# Patient Record
Sex: Male | Born: 1935 | Race: White | Hispanic: No | Marital: Married | Smoking: Former smoker
Health system: Southern US, Community
[De-identification: ages and names within clinical notes are randomized; demographics above are authoritative.]

## PROBLEM LIST (undated history)

## (undated) DIAGNOSIS — E785 Hyperlipidemia, unspecified: Secondary | ICD-10-CM

## (undated) DIAGNOSIS — I252 Old myocardial infarction: Secondary | ICD-10-CM

## (undated) DIAGNOSIS — I739 Peripheral vascular disease, unspecified: Secondary | ICD-10-CM

## (undated) DIAGNOSIS — E119 Type 2 diabetes mellitus without complications: Secondary | ICD-10-CM

## (undated) DIAGNOSIS — I10 Essential (primary) hypertension: Secondary | ICD-10-CM

## (undated) DIAGNOSIS — F0390 Unspecified dementia without behavioral disturbance: Secondary | ICD-10-CM

## (undated) HISTORY — PX: HERNIA REPAIR: SHX51

## (undated) HISTORY — DX: Type 2 diabetes mellitus without complications: E11.9

## (undated) HISTORY — DX: Peripheral vascular disease, unspecified: I73.9

## (undated) HISTORY — DX: Old myocardial infarction: I25.2

## (undated) HISTORY — DX: Essential (primary) hypertension: I10

## (undated) HISTORY — DX: Unspecified dementia, unspecified severity, without behavioral disturbance, psychotic disturbance, mood disturbance, and anxiety: F03.90

## (undated) HISTORY — DX: Hyperlipidemia, unspecified: E78.5

## (undated) HISTORY — PX: CORONARY ANGIOPLASTY: SHX604

---

## 2006-11-23 ENCOUNTER — Encounter: Payer: Self-pay | Admitting: Internal Medicine

## 2006-12-21 ENCOUNTER — Encounter: Payer: Self-pay | Admitting: Internal Medicine

## 2010-10-23 ENCOUNTER — Ambulatory Visit: Payer: Self-pay | Admitting: Internal Medicine

## 2012-08-04 ENCOUNTER — Inpatient Hospital Stay: Payer: Self-pay | Admitting: Internal Medicine

## 2012-08-04 LAB — COMPREHENSIVE METABOLIC PANEL
Albumin: 3.9 g/dL (ref 3.4–5.0)
Alkaline Phosphatase: 148 U/L — ABNORMAL HIGH (ref 50–136)
Anion Gap: 13 (ref 7–16)
Calcium, Total: 9.4 mg/dL (ref 8.5–10.1)
Chloride: 102 mmol/L (ref 98–107)
Creatinine: 1.42 mg/dL — ABNORMAL HIGH (ref 0.60–1.30)
EGFR (African American): 55 — ABNORMAL LOW
EGFR (Non-African Amer.): 47 — ABNORMAL LOW
Potassium: 4 mmol/L (ref 3.5–5.1)
Sodium: 138 mmol/L (ref 136–145)

## 2012-08-04 LAB — CBC
MCH: 28.9 pg (ref 26.0–34.0)
RDW: 14.4 % (ref 11.5–14.5)
WBC: 12.2 10*3/uL — ABNORMAL HIGH (ref 3.8–10.6)

## 2012-08-04 LAB — URINALYSIS, COMPLETE
Bilirubin,UR: NEGATIVE
Blood: NEGATIVE
Ketone: NEGATIVE
Leukocyte Esterase: NEGATIVE
Ph: 5 (ref 4.5–8.0)
RBC,UR: <1 /HPF (ref 0–5)
Squamous Epithelial: NONE SEEN

## 2012-08-04 LAB — LIPASE, BLOOD: Lipase: 511 U/L — ABNORMAL HIGH (ref 73–393)

## 2012-08-04 LAB — CK TOTAL AND CKMB (NOT AT ARMC)
CK, Total: 64 U/L (ref 35–232)
CK-MB: 1.1 ng/mL (ref 0.5–3.6)

## 2012-08-04 LAB — TROPONIN I: Troponin-I: 0.07 ng/mL — ABNORMAL HIGH

## 2012-08-05 DIAGNOSIS — I2510 Atherosclerotic heart disease of native coronary artery without angina pectoris: Secondary | ICD-10-CM

## 2012-08-05 DIAGNOSIS — R079 Chest pain, unspecified: Secondary | ICD-10-CM

## 2012-08-05 DIAGNOSIS — I214 Non-ST elevation (NSTEMI) myocardial infarction: Secondary | ICD-10-CM

## 2012-08-05 LAB — TROPONIN I
Troponin-I: 1.1 ng/mL — ABNORMAL HIGH
Troponin-I: 0.56 ng/mL — ABNORMAL HIGH

## 2012-08-05 LAB — LIPID PANEL
Cholesterol: 114 mg/dL (ref 0–200)
HDL Cholesterol: 46 mg/dL (ref 40–60)
Ldl Cholesterol, Calc: 48 mg/dL (ref 0–100)
Triglycerides: 99 mg/dL (ref 0–200)
VLDL Cholesterol, Calc: 20 mg/dL (ref 5–40)

## 2012-08-05 LAB — CBC WITH DIFFERENTIAL/PLATELET
Basophil #: 0.1 10*3/uL (ref 0.0–0.1)
Basophil %: 0.8 %
Lymphocyte #: 1.8 10*3/uL (ref 1.0–3.6)
Lymphocyte %: 18.2 %
Monocyte %: 11.8 %
Platelet: 182 10*3/uL (ref 150–440)
RBC: 4.48 10*6/uL (ref 4.40–5.90)
RDW: 14.1 % (ref 11.5–14.5)

## 2012-08-05 LAB — COMPREHENSIVE METABOLIC PANEL
Alkaline Phosphatase: 138 U/L — ABNORMAL HIGH (ref 50–136)
BUN: 15 mg/dL (ref 7–18)
Chloride: 103 mmol/L (ref 98–107)
Co2: 27 mmol/L (ref 21–32)
EGFR (African American): >60
SGPT (ALT): 25 U/L (ref 12–78)
Total Protein: 6.3 g/dL — ABNORMAL LOW (ref 6.4–8.2)

## 2012-08-05 LAB — PROTIME-INR
INR: 1
Prothrombin Time: 13.9 secs (ref 11.5–14.7)

## 2012-08-05 LAB — APTT: Activated PTT: 65.1 secs — ABNORMAL HIGH (ref 23.6–35.9)

## 2012-08-05 LAB — LIPASE, BLOOD: Lipase: 169 U/L (ref 73–393)

## 2012-08-05 LAB — MAGNESIUM: Magnesium: 2 mg/dL

## 2012-09-08 ENCOUNTER — Encounter: Payer: No Typology Code available for payment source | Admitting: Cardiovascular Disease

## 2013-08-12 ENCOUNTER — Inpatient Hospital Stay: Payer: Self-pay | Admitting: Surgery

## 2013-08-12 LAB — CBC WITH DIFFERENTIAL/PLATELET
Basophil #: 0.1 10*3/uL (ref 0.0–0.1)
Basophil %: 1 %
EOS ABS: 0.1 10*3/uL (ref 0.0–0.7)
Eosinophil %: 1.1 %
HCT: 40.1 % (ref 40.0–52.0)
HGB: 13.5 g/dL (ref 13.0–18.0)
Lymphocyte #: 1.1 10*3/uL (ref 1.0–3.6)
Lymphocyte %: 11.5 %
MCH: 30.6 pg (ref 26.0–34.0)
MCHC: 33.6 g/dL (ref 32.0–36.0)
MCV: 91 fL (ref 80–100)
Monocyte #: 0.6 x10 3/mm (ref 0.2–1.0)
Monocyte %: 6.1 %
NEUTROS PCT: 80.3 %
Neutrophil #: 7.8 10*3/uL — ABNORMAL HIGH (ref 1.4–6.5)
Platelet: 248 10*3/uL (ref 150–440)
RBC: 4.41 10*6/uL (ref 4.40–5.90)
RDW: 13.5 % (ref 11.5–14.5)
WBC: 9.8 10*3/uL (ref 3.8–10.6)

## 2013-08-12 LAB — COMPREHENSIVE METABOLIC PANEL
ALBUMIN: 3.3 g/dL — AB (ref 3.4–5.0)
ANION GAP: 8 (ref 7–16)
AST: 22 U/L (ref 15–37)
Alkaline Phosphatase: 152 U/L — ABNORMAL HIGH
BILIRUBIN TOTAL: 0.4 mg/dL (ref 0.2–1.0)
BUN: 14 mg/dL (ref 7–18)
CHLORIDE: 105 mmol/L (ref 98–107)
CO2: 26 mmol/L (ref 21–32)
Calcium, Total: 9 mg/dL (ref 8.5–10.1)
Creatinine: 1.17 mg/dL (ref 0.60–1.30)
GFR CALC NON AF AMER: 59 — AB
GLUCOSE: 156 mg/dL — AB (ref 65–99)
OSMOLALITY: 281 (ref 275–301)
POTASSIUM: 4 mmol/L (ref 3.5–5.1)
SGPT (ALT): 15 U/L (ref 12–78)
Sodium: 139 mmol/L (ref 136–145)
TOTAL PROTEIN: 6.8 g/dL (ref 6.4–8.2)

## 2013-08-12 LAB — URINALYSIS, COMPLETE
BLOOD: NEGATIVE
Bacteria: NONE SEEN
Bilirubin,UR: NEGATIVE
Glucose,UR: NEGATIVE mg/dL (ref 0–75)
Leukocyte Esterase: NEGATIVE
Nitrite: NEGATIVE
PH: 5 (ref 4.5–8.0)
Protein: NEGATIVE
RBC,UR: 1 /HPF (ref 0–5)
Specific Gravity: 1.036 (ref 1.003–1.030)

## 2013-08-12 LAB — TROPONIN I: Troponin-I: <0.02 ng/mL

## 2013-08-12 LAB — LIPASE, BLOOD: Lipase: 242 U/L (ref 73–393)

## 2013-08-13 LAB — CBC WITH DIFFERENTIAL/PLATELET
Basophil #: 0 10*3/uL (ref 0.0–0.1)
Basophil %: 0.4 %
Eosinophil #: 0 10*3/uL (ref 0.0–0.7)
Eosinophil %: 0.1 %
HCT: 36.6 % — AB (ref 40.0–52.0)
HGB: 11.9 g/dL — AB (ref 13.0–18.0)
LYMPHS ABS: 0.8 10*3/uL — AB (ref 1.0–3.6)
LYMPHS PCT: 6.2 %
MCH: 29.9 pg (ref 26.0–34.0)
MCHC: 32.4 g/dL (ref 32.0–36.0)
MCV: 92 fL (ref 80–100)
MONOS PCT: 6.4 %
Monocyte #: 0.8 x10 3/mm (ref 0.2–1.0)
NEUTROS ABS: 10.8 10*3/uL — AB (ref 1.4–6.5)
Neutrophil %: 86.9 %
Platelet: 210 10*3/uL (ref 150–440)
RBC: 3.98 10*6/uL — ABNORMAL LOW (ref 4.40–5.90)
RDW: 13.6 % (ref 11.5–14.5)
WBC: 12.4 10*3/uL — AB (ref 3.8–10.6)

## 2013-08-13 LAB — BASIC METABOLIC PANEL
ANION GAP: 5 — AB (ref 7–16)
BUN: 20 mg/dL — AB (ref 7–18)
CHLORIDE: 103 mmol/L (ref 98–107)
CO2: 27 mmol/L (ref 21–32)
Calcium, Total: 8.2 mg/dL — ABNORMAL LOW (ref 8.5–10.1)
Creatinine: 1.24 mg/dL (ref 0.60–1.30)
EGFR (Non-African Amer.): 55 — ABNORMAL LOW
Glucose: 205 mg/dL — ABNORMAL HIGH (ref 65–99)
Osmolality: 279 (ref 275–301)
Potassium: 4.5 mmol/L (ref 3.5–5.1)
Sodium: 135 mmol/L — ABNORMAL LOW (ref 136–145)

## 2013-08-14 LAB — BASIC METABOLIC PANEL
Anion Gap: 4 — ABNORMAL LOW (ref 7–16)
BUN: 19 mg/dL — ABNORMAL HIGH (ref 7–18)
CHLORIDE: 104 mmol/L (ref 98–107)
Calcium, Total: 8.5 mg/dL (ref 8.5–10.1)
Co2: 29 mmol/L (ref 21–32)
Creatinine: 1.21 mg/dL (ref 0.60–1.30)
EGFR (Non-African Amer.): 57 — ABNORMAL LOW
Glucose: 146 mg/dL — ABNORMAL HIGH (ref 65–99)
Osmolality: 279 (ref 275–301)
Potassium: 4.5 mmol/L (ref 3.5–5.1)
Sodium: 137 mmol/L (ref 136–145)

## 2013-08-14 LAB — CBC WITH DIFFERENTIAL/PLATELET
BASOS ABS: 0.1 10*3/uL (ref 0.0–0.1)
Basophil %: 0.4 %
Eosinophil #: 0.1 10*3/uL (ref 0.0–0.7)
Eosinophil %: 0.5 %
HCT: 35.1 % — ABNORMAL LOW (ref 40.0–52.0)
HGB: 11.2 g/dL — ABNORMAL LOW (ref 13.0–18.0)
LYMPHS PCT: 8.8 %
Lymphocyte #: 1 10*3/uL (ref 1.0–3.6)
MCH: 29.3 pg (ref 26.0–34.0)
MCHC: 31.8 g/dL — ABNORMAL LOW (ref 32.0–36.0)
MCV: 92 fL (ref 80–100)
MONOS PCT: 6.8 %
Monocyte #: 0.8 x10 3/mm (ref 0.2–1.0)
Neutrophil #: 9.7 10*3/uL — ABNORMAL HIGH (ref 1.4–6.5)
Neutrophil %: 83.5 %
Platelet: 195 10*3/uL (ref 150–440)
RBC: 3.82 10*6/uL — ABNORMAL LOW (ref 4.40–5.90)
RDW: 13.6 % (ref 11.5–14.5)
WBC: 11.6 10*3/uL — ABNORMAL HIGH (ref 3.8–10.6)

## 2013-08-14 LAB — TSH: THYROID STIMULATING HORM: 0.907 u[IU]/mL

## 2013-08-14 LAB — HEMOGLOBIN A1C: Hemoglobin A1C: 6.7 % — ABNORMAL HIGH (ref 4.2–6.3)

## 2013-08-15 LAB — CBC WITH DIFFERENTIAL/PLATELET
Basophil #: 0.1 10*3/uL (ref 0.0–0.1)
Basophil %: 0.6 %
EOS ABS: 0.2 10*3/uL (ref 0.0–0.7)
Eosinophil %: 1.5 %
HCT: 34.3 % — AB (ref 40.0–52.0)
HGB: 11.5 g/dL — AB (ref 13.0–18.0)
LYMPHS ABS: 1.1 10*3/uL (ref 1.0–3.6)
Lymphocyte %: 10.5 %
MCH: 30.6 pg (ref 26.0–34.0)
MCHC: 33.6 g/dL (ref 32.0–36.0)
MCV: 91 fL (ref 80–100)
Monocyte #: 0.8 x10 3/mm (ref 0.2–1.0)
Monocyte %: 7.8 %
NEUTROS ABS: 8.4 10*3/uL — AB (ref 1.4–6.5)
NEUTROS PCT: 79.6 %
Platelet: 217 10*3/uL (ref 150–440)
RBC: 3.77 10*6/uL — ABNORMAL LOW (ref 4.40–5.90)
RDW: 13.4 % (ref 11.5–14.5)
WBC: 10.5 10*3/uL (ref 3.8–10.6)

## 2013-08-16 LAB — BASIC METABOLIC PANEL
ANION GAP: 2 — AB (ref 7–16)
BUN: 10 mg/dL (ref 7–18)
CALCIUM: 8.6 mg/dL (ref 8.5–10.1)
CO2: 32 mmol/L (ref 21–32)
Chloride: 108 mmol/L — ABNORMAL HIGH (ref 98–107)
Creatinine: 0.84 mg/dL (ref 0.60–1.30)
EGFR (African American): >60
EGFR (Non-African Amer.): >60
Glucose: 140 mg/dL — ABNORMAL HIGH (ref 65–99)
OSMOLALITY: 284 (ref 275–301)
Potassium: 3.6 mmol/L (ref 3.5–5.1)
Sodium: 142 mmol/L (ref 136–145)

## 2013-08-16 LAB — CBC WITH DIFFERENTIAL/PLATELET
Basophil #: 0.1 10*3/uL (ref 0.0–0.1)
Basophil %: 1.1 %
Eosinophil #: 0.6 10*3/uL (ref 0.0–0.7)
Eosinophil %: 6.2 %
HCT: 36.7 % — AB (ref 40.0–52.0)
HGB: 11.9 g/dL — ABNORMAL LOW (ref 13.0–18.0)
LYMPHS ABS: 1.3 10*3/uL (ref 1.0–3.6)
Lymphocyte %: 13.3 %
MCH: 29.5 pg (ref 26.0–34.0)
MCHC: 32.5 g/dL (ref 32.0–36.0)
MCV: 91 fL (ref 80–100)
Monocyte #: 0.9 x10 3/mm (ref 0.2–1.0)
Monocyte %: 9 %
NEUTROS PCT: 70.4 %
Neutrophil #: 6.9 10*3/uL — ABNORMAL HIGH (ref 1.4–6.5)
PLATELETS: 246 10*3/uL (ref 150–440)
RBC: 4.05 10*6/uL — ABNORMAL LOW (ref 4.40–5.90)
RDW: 13.4 % (ref 11.5–14.5)
WBC: 9.8 10*3/uL (ref 3.8–10.6)

## 2013-08-17 LAB — BASIC METABOLIC PANEL
Anion Gap: 7 (ref 7–16)
BUN: 7 mg/dL (ref 7–18)
CALCIUM: 7.9 mg/dL — AB (ref 8.5–10.1)
CHLORIDE: 106 mmol/L (ref 98–107)
CREATININE: 0.83 mg/dL (ref 0.60–1.30)
Co2: 26 mmol/L (ref 21–32)
EGFR (African American): >60
EGFR (Non-African Amer.): >60
GLUCOSE: 231 mg/dL — AB (ref 65–99)
Osmolality: 283 (ref 275–301)
Potassium: 3.7 mmol/L (ref 3.5–5.1)
Sodium: 139 mmol/L (ref 136–145)

## 2013-08-17 LAB — CBC WITH DIFFERENTIAL/PLATELET
Basophil #: 0.1 10*3/uL (ref 0.0–0.1)
Basophil %: 0.8 %
Eosinophil #: 0.1 10*3/uL (ref 0.0–0.7)
Eosinophil %: 1.1 %
HCT: 33.7 % — AB (ref 40.0–52.0)
HGB: 11.4 g/dL — AB (ref 13.0–18.0)
LYMPHS PCT: 6.3 %
Lymphocyte #: 0.6 10*3/uL — ABNORMAL LOW (ref 1.0–3.6)
MCH: 30.5 pg (ref 26.0–34.0)
MCHC: 33.9 g/dL (ref 32.0–36.0)
MCV: 90 fL (ref 80–100)
Monocyte #: 0.8 x10 3/mm (ref 0.2–1.0)
Monocyte %: 7.4 %
Neutrophil #: 8.7 10*3/uL — ABNORMAL HIGH (ref 1.4–6.5)
Neutrophil %: 84.4 %
PLATELETS: 250 10*3/uL (ref 150–440)
RBC: 3.74 10*6/uL — AB (ref 4.40–5.90)
RDW: 13.4 % (ref 11.5–14.5)
WBC: 10.3 10*3/uL (ref 3.8–10.6)

## 2013-08-17 LAB — MAGNESIUM: MAGNESIUM: 1.5 mg/dL — AB

## 2013-08-17 LAB — POTASSIUM: Potassium: 4 mmol/L (ref 3.5–5.1)

## 2013-08-18 LAB — BASIC METABOLIC PANEL
Anion Gap: 3 — ABNORMAL LOW (ref 7–16)
BUN: 5 mg/dL — AB (ref 7–18)
CO2: 29 mmol/L (ref 21–32)
Calcium, Total: 8.2 mg/dL — ABNORMAL LOW (ref 8.5–10.1)
Chloride: 109 mmol/L — ABNORMAL HIGH (ref 98–107)
Creatinine: 0.85 mg/dL (ref 0.60–1.30)
EGFR (African American): >60
EGFR (Non-African Amer.): >60
Glucose: 168 mg/dL — ABNORMAL HIGH (ref 65–99)
OSMOLALITY: 282 (ref 275–301)
Potassium: 4.3 mmol/L (ref 3.5–5.1)
Sodium: 141 mmol/L (ref 136–145)

## 2013-08-18 LAB — CBC WITH DIFFERENTIAL/PLATELET
BASOS PCT: 0.5 %
Basophil #: 0.1 10*3/uL (ref 0.0–0.1)
EOS ABS: 0.6 10*3/uL (ref 0.0–0.7)
EOS PCT: 4.6 %
HCT: 36.7 % — AB (ref 40.0–52.0)
HGB: 12.1 g/dL — AB (ref 13.0–18.0)
LYMPHS PCT: 8 %
Lymphocyte #: 1 10*3/uL (ref 1.0–3.6)
MCH: 30.1 pg (ref 26.0–34.0)
MCHC: 33 g/dL (ref 32.0–36.0)
MCV: 91 fL (ref 80–100)
Monocyte #: 1.2 x10 3/mm — ABNORMAL HIGH (ref 0.2–1.0)
Monocyte %: 9.4 %
NEUTROS ABS: 9.5 10*3/uL — AB (ref 1.4–6.5)
Neutrophil %: 77.5 %
PLATELETS: 251 10*3/uL (ref 150–440)
RBC: 4.02 10*6/uL — ABNORMAL LOW (ref 4.40–5.90)
RDW: 13.7 % (ref 11.5–14.5)
WBC: 12.3 10*3/uL — ABNORMAL HIGH (ref 3.8–10.6)

## 2013-08-19 LAB — CBC WITH DIFFERENTIAL/PLATELET
Basophil #: 0.1 10*3/uL (ref 0.0–0.1)
Basophil %: 1 %
Eosinophil #: 0.5 10*3/uL (ref 0.0–0.7)
Eosinophil %: 4.2 %
HCT: 36.8 % — AB (ref 40.0–52.0)
HGB: 11.8 g/dL — AB (ref 13.0–18.0)
LYMPHS PCT: 10.5 %
Lymphocyte #: 1.3 10*3/uL (ref 1.0–3.6)
MCH: 29.1 pg (ref 26.0–34.0)
MCHC: 32 g/dL (ref 32.0–36.0)
MCV: 91 fL (ref 80–100)
Monocyte #: 1.1 x10 3/mm — ABNORMAL HIGH (ref 0.2–1.0)
Monocyte %: 9.3 %
Neutrophil #: 9 10*3/uL — ABNORMAL HIGH (ref 1.4–6.5)
Neutrophil %: 75 %
PLATELETS: 283 10*3/uL (ref 150–440)
RBC: 4.05 10*6/uL — AB (ref 4.40–5.90)
RDW: 13.9 % (ref 11.5–14.5)
WBC: 12 10*3/uL — ABNORMAL HIGH (ref 3.8–10.6)

## 2013-08-19 LAB — BASIC METABOLIC PANEL
Anion Gap: 4 — ABNORMAL LOW (ref 7–16)
BUN: 7 mg/dL (ref 7–18)
Calcium, Total: 8.3 mg/dL — ABNORMAL LOW (ref 8.5–10.1)
Chloride: 108 mmol/L — ABNORMAL HIGH (ref 98–107)
Co2: 29 mmol/L (ref 21–32)
Creatinine: 0.85 mg/dL (ref 0.60–1.30)
EGFR (African American): >60
EGFR (Non-African Amer.): >60
Glucose: 136 mg/dL — ABNORMAL HIGH (ref 65–99)
Osmolality: 281 (ref 275–301)
Potassium: 3.9 mmol/L (ref 3.5–5.1)
Sodium: 141 mmol/L (ref 136–145)

## 2013-08-22 LAB — CBC WITH DIFFERENTIAL/PLATELET
Basophil #: 0.1 10*3/uL (ref 0.0–0.1)
Basophil %: 1.3 %
Eosinophil #: 0.2 10*3/uL (ref 0.0–0.7)
Eosinophil %: 2.7 %
HCT: 34.6 % — AB (ref 40.0–52.0)
HGB: 11.4 g/dL — ABNORMAL LOW (ref 13.0–18.0)
LYMPHS PCT: 14.1 %
Lymphocyte #: 1.2 10*3/uL (ref 1.0–3.6)
MCH: 29.8 pg (ref 26.0–34.0)
MCHC: 33 g/dL (ref 32.0–36.0)
MCV: 90 fL (ref 80–100)
Monocyte #: 0.7 x10 3/mm (ref 0.2–1.0)
Monocyte %: 8.6 %
Neutrophil #: 6.2 10*3/uL (ref 1.4–6.5)
Neutrophil %: 73.3 %
Platelet: 276 10*3/uL (ref 150–440)
RBC: 3.83 10*6/uL — AB (ref 4.40–5.90)
RDW: 13.8 % (ref 11.5–14.5)
WBC: 8.5 10*3/uL (ref 3.8–10.6)

## 2013-08-22 LAB — BASIC METABOLIC PANEL
Anion Gap: 3 — ABNORMAL LOW (ref 7–16)
BUN: 4 mg/dL — ABNORMAL LOW (ref 7–18)
CALCIUM: 8 mg/dL — AB (ref 8.5–10.1)
CHLORIDE: 107 mmol/L (ref 98–107)
Co2: 31 mmol/L (ref 21–32)
Creatinine: 0.69 mg/dL (ref 0.60–1.30)
EGFR (African American): >60
EGFR (Non-African Amer.): >60
Glucose: 141 mg/dL — ABNORMAL HIGH (ref 65–99)
Osmolality: 281 (ref 275–301)
POTASSIUM: 3.1 mmol/L — AB (ref 3.5–5.1)
SODIUM: 141 mmol/L (ref 136–145)

## 2013-10-19 ENCOUNTER — Encounter: Payer: Self-pay | Admitting: Podiatrist

## 2013-10-19 ENCOUNTER — Ambulatory Visit (INDEPENDENT_AMBULATORY_CARE_PROVIDER_SITE_OTHER): Payer: Medicare Other | Admitting: Podiatrist

## 2013-10-19 VITALS — Resp 16 | Ht 63.0 in | Wt 216.0 lb

## 2013-10-19 DIAGNOSIS — M79609 Pain in unspecified limb: Secondary | ICD-10-CM

## 2013-10-19 DIAGNOSIS — B351 Tinea unguium: Secondary | ICD-10-CM

## 2013-10-19 NOTE — Progress Notes (Signed)
   Subjective:    Patient ID: Shaun Davis, male    DOB: October 08, 1932, 80 y.o.   MRN: [id number]  HPI Comments: My toenails need trimming and they do hurt. A nurse would trim his toenails and he would trim them and we went to see dr cline for toenails. He wouldn't go back to dr cline.     Review of Systems     Objective:   Physical Exam  GENERAL APPEARANCE: Alert, conversant. Appropriately groomed. No acute distress.  VASCULAR: Pedal pulses palpable at 1/4 DP and PT bilateral.  Capillary refill time is intact to all digits,   NEUROLOGIC: sensation is intact epicritically and protectively to 5.07 monofilament at 5/5 sites bilateral.  Light touch is intact bilateral, vibratory sensation intact bilateral, achilles tendon reflex is intact bilateral.  MUSCULOSKELETAL: acceptable muscle strength, tone and stability bilateral.  Intrinsic muscluature intact bilateral.   DERMATOLOGIC: toenails are severely mycotic, thick, and discolored.  They is no sign of bacterial infection or paronychia present. No preulcerative lesions are seen, no interdigital maceration noted.  No open lesions present.         Assessment & Plan:  Diabetes with peripheral angiopathy, symptomatic mycotic toenails Plan:  Diabetic exam performed.  Toenails debrided today.  Patients wife is wondering if he should have his large toenails removed.  I advised against this due to him not having any infection rushing the removal of the toenails and the fact that they are so neglected and long, if they are painful the pain will likely subside after a simple trimming as long as we can keep up the with nails routinely.

## 2013-10-19 NOTE — Patient Instructions (Signed)
Diabetes and Foot Care Diabetes may cause you to have problems because of poor blood supply (circulation) to your feet and legs. This may cause the skin on your feet to become thinner, break easier, and heal more slowly. Your skin may become dry, and the skin may peel and crack. You may also have nerve damage in your legs and feet causing decreased feeling in them. You may not notice minor injuries to your feet that could lead to infections or more serious problems. Taking care of your feet is one of the most important things you can do for yourself.  HOME CARE INSTRUCTIONS  Wear shoes at all times, even in the house. Do not go barefoot. Bare feet are easily injured.  Check your feet daily for blisters, cuts, and redness. If you cannot see the bottom of your feet, use a mirror or ask someone for help.  Wash your feet with warm water (do not use hot water) and mild soap. Then pat your feet and the areas between your toes until they are completely dry. Do not soak your feet as this can dry your skin.  Apply a moisturizing lotion or petroleum jelly (that does not contain alcohol and is unscented) to the skin on your feet and to dry, brittle toenails. Do not apply lotion between your toes.  Trim your toenails straight across. Do not dig under them or around the cuticle. File the edges of your nails with an emery board or nail file.  Do not cut corns or calluses or try to remove them with medicine.  Wear clean socks or stockings every day. Make sure they are not too tight. Do not wear knee-high stockings since they may decrease blood flow to your legs.  Wear shoes that fit properly and have enough cushioning. To break in new shoes, wear them for just a few hours a day. This prevents you from injuring your feet. Always look in your shoes before you put them on to be sure there are no objects inside.  Do not cross your legs. This may decrease the blood flow to your feet.  If you find a minor scrape,  cut, or break in the skin on your feet, keep it and the skin around it clean and dry. These areas may be cleansed with mild soap and water. Do not cleanse the area with peroxide, alcohol, or iodine.  When you remove an adhesive bandage, be sure not to damage the skin around it.  If you have a wound, look at it several times a day to make sure it is healing.  Do not use heating pads or hot water bottles. They may burn your skin. If you have lost feeling in your feet or legs, you may not know it is happening until it is too late.  Make sure your health care provider performs a complete foot exam at least annually or more often if you have foot problems. Report any cuts, sores, or bruises to your health care provider immediately. SEEK MEDICAL CARE IF:   You have an injury that is not healing.  You have cuts or breaks in the skin.  You have an ingrown nail.  You notice redness on your legs or feet.  You feel burning or tingling in your legs or feet.  You have pain or cramps in your legs and feet.  Your legs or feet are numb.  Your feet always feel cold. SEEK IMMEDIATE MEDICAL CARE IF:   There is increasing redness,   swelling, or pain in or around a wound.  There is a red line that goes up your leg.  Pus is coming from a wound.  You develop a fever or as directed by your health care provider.  You notice a bad smell coming from an ulcer or wound. Document Released: 06/05/2000 Document Revised: 02/08/2013 Document Reviewed: 11/15/2012 Exit[employer] Patient Information 2014 [employer].  

## 2014-01-18 ENCOUNTER — Ambulatory Visit: Payer: Medicare Other | Admitting: Podiatrist

## 2014-10-12 NOTE — Consult Note (Signed)
General Aspect 81 year old male with a known history of dementia, peripheral vascular disease, hypertension, history of MI status post angioplasty in 1986 at [facility] presentig after a fall, chest pain.Cardiology was consulted for chest pain, NSTEMI.  The patient lives with his wife, normally does not go outside the home and walks around in the home without much difficulty, He went outside to pick up the mail and slipped on the ice and slid into the ditch. He was not able to get up. Nearby friends and neighbors saw him and helped him to get up.   EMS was called who  requested him to come to the Emergency Department. He was very adamant not wanting to come to the ER. On further questioning by family and EMS, he acknowledged having intermittent chest pain for the last 2 days, mainly midsternal in location, and he finally decided to come to the Emergency Department. he was hurting in the chest and right knee on which he fell.   While in the ED, he was found to have troponin of 0.07 and was still hurting about 7 out of 10. He denies any injury on his chest pain and describes this pain as a [place] kicking on him on the chest.    previous heart attack in 1986.   Present Illness . He is having a difficulty walking, is unable to lie flat for about a year now and usually just sleeps in the chair. Legs are swollen and getting worse.  Unna boots have been placed for his leg swelling. They are also having a hard time keeping his legs elevated as he is unable to lie flat.    PAST MEDICAL HISTORY:   1.  Diabetes.  2.  Hypertension.  3.  Hyperlipidemia.  4.  History of MI status post angioplasty in 1986.  5.  Peripheral vascular disease.  6.  Dementia.   ALLERGIES:  PENICILLIN.   FAMILY HISTORY:  Mother died of heart attack.   SOCIAL HISTORY:  No smoking. No alcohol. No IV drugs of abuse. He lives at home with his wife.   Physical Exam:  GEN well developed, well nourished, no acute distress    HEENT pink conjunctivae   NECK supple   RESP clear BS   CARD Regular rate and rhythm  No murmur   ABD denies tenderness  soft   LYMPH negative neck   EXTR negative edema   SKIN normal to palpation   NEURO motor/sensory function intact, back pain   PSYCH alert, A+O to time, place, person, good insight   Review of Systems:  Subjective/Chief Complaint BAck pain, chest pain resolved   General: Fatigue  Weakness   Skin: No Complaints   ENT: No Complaints   Neck: No Complaints   Respiratory: No Complaints   Cardiovascular: Chest pain or discomfort   Gastrointestinal: No Complaints   Vascular: No Complaints   Musculoskeletal: back pain   Neurologic: No Complaints   Hematologic: No Complaints   Endocrine: No Complaints   Psychiatric: No Complaints   Review of Systems: All other systems were reviewed and found to be negative   Medications/Allergies Reviewed Medications/Allergies reviewed        Admit Diagnosis:   CHEST PAIN: Onset Date: 05-Aug-2012, Status: Active, Description: CHEST PAIN  Home Medications: Medication Instructions Status  verapamil 200 mg/24 hours oral capsule, extended release 1 cap(s) orally once a day (at bedtime) Active  quinapril 20 mg oral tablet 1 tab(s) orally once a day Active  metformin extended release 500 mg oral tablet, extended release 2 tab(s) orally 2 times a day Active  aspirin 325 mg oral tablet 1 tab(s) orally once a day Active  Tylenol 325 mg oral tablet 2 tab(s) orally every 4 hours, As Needed- for Pain  Active  atorvastatin 40 mg oral tablet 1 tab(s) orally once a day (at bedtime) Active  NovoLog FlexPen 100 units/mL subcutaneous solution 40 unit(s) subcutaneous at lunch Active  NovoLog FlexPen 100 units/mL subcutaneous solution 30 unit(s) subcutaneous once a day, with dinner Active  donepezil 10 mg oral tablet 1 tab(s) orally once a day (at bedtime) Active  Levemir 100 units/mL subcutaneous solution 40 unit(s)  subcutaneous once a day, in the AM Active  Levemir 100 units/mL subcutaneous solution 50 unit(s) subcutaneous once a day (at bedtime) Active  tramadol 50 mg oral tablet 1 tab(s) orally every 6 hours, As Needed- for Pain  Active  torsemide 20 mg oral tablet 1 tab(s) orally once a day Active  Namenda 5 mg oral tablet 1 tab(s) orally once a day Active   Lab Results:  Hepatic:  14-Feb-14 04:25   Bilirubin, Total -  Alkaline Phosphatase -  SGPT (ALT) -  SGOT (AST) -  Total Protein, Serum -  Albumin, Serum -  Routine Chem:  14-Feb-14 04:25   Amylase, Serum - (Result(s) reported on 05 Aug 2012 at 06:57AM.)  Cholesterol, Serum -  Triglycerides, Serum -  HDL (INHOUSE) -  VLDL Cholesterol Calculated -  LDL Cholesterol Calculated - (Result(s) reported on 05 Aug 2012 at 06:57AM.)  Magnesium, Serum - (1.8-2.4 THERAPEUTIC RANGE: 4-7 mg/dL TOXIC: > 10 mg/dL  -----------------------)  Lipase -  Glucose, Serum -  BUN -  Creatinine (comp) -  Sodium, Serum -  Potassium, Serum -  Chloride, Serum -  CO2, Serum -  Calcium (Total), Serum -  Osmolality (calc) -  eGFR (African American) -  eGFR (Non-African American) - (eGFR values <40m/min/1.73 m2 may be an indication of chronic kidney disease (CKD). Calculated eGFR is useful in patients with stable renal function. The eGFR calculation will not be reliable in acutely ill patients when serum creatinine is changing rapidly. It is not useful in  patients on dialysis. The eGFR calculation may not be applicable to patients at the low and high extremes of body sizes, pregnant women, and vegetarians.)  Anion Gap -  Result Comment lipase - quantity not suffient  Result(s) reported on 05 Aug 2012 at 06:38AM.  Cardiac:  14-Feb-14 04:25   Troponin I  1.10 (0.00-0.05 0.05 ng/mL or less: NEGATIVE  Repeat testing in 3-6 hrs  if clinically indicated. >0.05 ng/mL: POTENTIAL  MYOCARDIAL INJURY. Repeat  testing in 3-6 hrs if  clinically  indicated. NOTE: An increase or decrease  of 30% or more on serial  testing suggests a  clinically important change)  Routine Coag:  14-Feb-14 04:25   Activated PTT (APTT)  > 160.0 (A HCT value >55% may artifactually increase the APTT. In one study, the increase was an average of 19%. Reference: "Effect on Routine and Special Coagulation Testing Values of Citrate Anticoagulant Adjustment in Patients with High HCT Values." American Journal of Clinical Pathology 2006;126:400-405.)  Prothrombin 13.9  INR 1.0 (INR reference interval applies to patients on anticoagulant therapy. A single INR therapeutic range for coumarins is not optimal for all indications; however, the suggested range for most indications is 2.0 - 3.0. Exceptions to the INR Reference Range may include: Prosthetic heart valves, acute myocardial infarction, prevention of  myocardial infarction, and combinations of aspirin and anticoagulant. The need for a higher or lower target INR must be assessed individually. Reference: The Pharmacology and Management of the Vitamin K  antagonists: the seventh ACCP Conference on Antithrombotic and Thrombolytic Therapy. ULAGT.3646 Sept:126 (3suppl): [id number]. A HCT value >55% may artifactually increase the PT.  In one study,  the increase was an average of 25%. Reference:  "Effect on Routine and Special Coagulation Testing Values of Citrate Anticoagulant Adjustment in Patients with High HCT Values." American Journal of Clinical Pathology 2006;126:400-405.)  Routine Hem:  14-Feb-14 04:25   WBC (CBC) 9.7  RBC (CBC) 4.48  Hemoglobin (CBC) 13.2  Hematocrit (CBC)  39.8  Platelet Count (CBC) 182  MCV 89  MCH 29.5  MCHC 33.3  RDW 14.1  Neutrophil % 67.0  Lymphocyte % 18.2  Monocyte % 11.8  Eosinophil % 2.2  Basophil % 0.8  Neutrophil # 6.5  Lymphocyte # 1.8  Monocyte #  1.1  Eosinophil # 0.2  Basophil # 0.1 (Result(s) reported on 05 Aug 2012 at 05:11AM.)   EKG:   Interpretation EKG shows NSR with LAD, PVCs   Radiology Results: XRay:    13-Feb-14 18:09, Chest PA and Lateral  Chest PA and Lateral   REASON FOR EXAM:    cp  COMMENTS:       PROCEDURE: DXR - DXR CHEST PA (OR AP) AND LATERAL  - Aug 04 2012  6:09PM     RESULT: Mild atelectasis versus infiltrates lung bases. Heart size normal.    IMPRESSION:  Mild atelectasis versus infiltrates both lung bases.        Verified By: Osa Craver, M.D., MD    Penicillin: Hives  Vital Signs/Nurse's Notes: **Vital Signs.:   14-Feb-14 08:40  Vital Signs Type Routine  Temperature Temperature (F) 98  Celsius 36.6  Pulse Pulse 77  Respirations Respirations 18  Systolic BP Systolic BP 803  Diastolic BP (mmHg) Diastolic BP (mmHg) 64  Mean BP 80  Pulse Ox % Pulse Ox % 94  Pulse Ox Activity Level  At rest  Oxygen Delivery 2L    Impression 81 year old male with a known history of dementia, peripheral vascular disease, hypertension, history of MI status post angioplasty in 1986 at [facility] presentig after a fall, chest pain.Cardiology was consulted for chest pain, NSTEMI.  1) Chest pain, NSTEMI troponin to 1 Chest pain free Atypical and typical symptoms known CAD in the past at [facility] in the 1980s. STress test ordered, completed this AM, results pending --COntinue asa,ace, statin,  consider very low dose b-blocker  2) Edema recommend echo to exclude pulmonary HTN/diastolic CHF WOuld continue torsemide  3) Unsteady gait fell in the ice, chronic back pain May need rehab?  4) Dementia appropriate today   Electronic Signatures: Ida Rogue (MD)  (Signed 14-Feb-14 10:58)  Authored: General Aspect/Present Illness, History and Physical Exam, Review of System, Health Issues, Home Medications, Labs, EKG , Radiology, Allergies, Vital Signs/Nurse's Notes, Impression/Plan   Last Updated: 14-Feb-14 10:58 by Ida Rogue (MD)

## 2014-10-12 NOTE — H&P (Signed)
PATIENT NAME:  Shaun Davis, Shaun Davis MR#:  [id number] DATE OF BIRTH:  1933-03-27  DATE OF ADMISSION:  08/04/2012  PRIMARY CARE PHYSICIAN:  Katherina Right C. Arlana Pouch, MD  REQUESTING PHYSICIAN:  Caleen Jobs. Braud, MD  CHIEF COMPLAINT:  Chest pain.   HISTORY OF PRESENT ILLNESS:  This is a 81 year old male with a known history of dementia, peripheral vascular disease, hypertension, history of MI status post angioplasty in 1986 at [facility] is being admitted for unstable angina and possible non-ST elevation MI. The patient normally does not go outside the home and walks around in the home without much difficulty, but today he went outside without anybody's notice to pick up the mail and slipped on the ice and slid into the ditch next to the mailbox. He was not able to get up. Nearby friends and neighbors saw him and helped him to get up and moved him close to his front porch. EMS was called who slowly walked him to the garage. EMS requested him to come to the Emergency Department. He was very adamant not wanting to come to the ER. On further questioning by family and EMS, he acknowledged having intermittent chest pain for the last 2 days, mainly midsternal in location, and he finally decided to come to the Emergency Department as he was hurting in the chest and right knee on which he fell. While in the ED, he was found to have troponin of 0.07 and was still hurting about 7 out of 10 in severity in the midsternal region. He denies any injury on his chest pain and describes this pain as a mule kicking on him on the chest. Denies any radiation of pain and feels his pain is similar to his previous heart attack in 1986.   He denies any shortness of breath, fever, cough, but he is having a real difficulty walking as normally he walks in the home holding things and walks from bedroom to the kitchen as per his wife who is at the bedside along with granddaughter. The patient is unable to lie flat for about a year now and usually  just sleeps in the chair. His legs are also getting worse. He does have significant peripheral vascular disease and has been getting regular wound dressing along with Unna boots because of his leg swelling. They are also having a hard time keeping his legs elevated as he is unable to lie flat.   PAST MEDICAL HISTORY:   1.  Diabetes.  2.  Hypertension.  3.  Hyperlipidemia.  4.  History of MI status post angioplasty in 1986.  5.  Peripheral vascular disease.  6.  Dementia.   ALLERGIES:  PENICILLIN.   FAMILY HISTORY:  Mother died of heart attack.   SOCIAL HISTORY:  No smoking. No alcohol. No IV drugs of abuse. He lives at home with his wife.   MEDICATIONS AT HOME:   1.  Aspirin 325 mg p.o. daily.  2.  Lipitor 40 mg p.o. at bedtime.  3.  Donepezil 10 mg p.o. daily.  4.  Insulin Levemir 40 units subcutaneous every morning and 50 units subcutaneous every night.  5.  Metformin 500 mg 2 tablets p.o. b.i.d.  6.  Namenda 5 mg p.o. daily.  7.  NovoLog FlexPen 30 units subcutaneous daily with dinner and 40 units subcutaneous at lunch.  8.  Quinapril 20 mg p.o. daily.  9.  Torsemide 20 mg p.o. daily.  10.  Tramadol 50 mg p.o. every 6 hours as needed.  11.  Tylenol 650 mg p.o. every 4 hours as needed.  12. verapamil 200 mg p.o. at bedtime.   REVIEW OF SYSTEMS:  CONSTITUTIONAL: No fever. Positive fatigue and weakness.  EYES: No blurred or double vision.  ENT: No tinnitus or ear pain.  RESPIRATORY: No cough, wheezing, hemoptysis.  CARDIOVASCULAR: Positive for chest pain and lower extremity edema. History of MI.  GASTROINTESTINAL: No nausea, vomiting, diarrhea.  GENITOURINARY: No dysuria or hematuria.  ENDOCRINE: No polyuria or nocturia.  HEMATOLOGY: No anemia. He does have easy bruising.  MUSCULOSKELETAL: Difficulty walking and right knee pain status post fall. Also, he has back pain as he is not used to lying down at home like this in the hospital bed. He normally just sits in the chair even  for sleeping and he cannot lie flat.  NEUROLOGICAL: No tingling, numbness or weakness.  PSYCHIATRIC: No history of anxiety or depression.   PHYSICAL EXAMINATION: VITAL SIGNS: Temperature 98, heart rate 86, respirations 16 per minute, blood pressure 103/58 mmHg, saturating 94% on room air.  GENERAL: The patient is a 81 year old male lying in the bed comfortably without any acute distress.  EYES: Pupils equal, round, and reactive to light and accommodation. No scleral icterus. Extraocular muscles are intact.  HEENT: Head is atraumatic, normocephalic. Oropharynx and nasopharynx clear.  NECK: Supple. No jugular venous distention. No thyroid enlargement or tenderness.  LUNGS: Clear to auscultation bilaterally. No wheezing, rales, rhonchi or crepitation.  CARDIOVASCULAR: S1, S2 normal. No murmurs, rubs or gallop.  ABDOMEN: Soft, obese, nontender, nondistended. Bowel sounds present. No organomegaly or masses. EXTREMITIES: Unna boots in place wrapped on both feet. He does seem to have very dry skin underneath. Likely, he has chronic lymphedema underneath and has very dry skin and ecchymotic nails.  NEUROLOGIC: Cranial nerves II through XII intact. Muscle strength 5 out of 5 in all extremities. Sensation is intact.  PSYCHIATRIC: The patient seems oriented and alert x 3.   DIAGNOSTIC DATA:  Normal BMP except BUN of 20, creatinine 1.42, blood glucose 172, magnesium 1.6 and lipase is 511. Normal LFTs except alkaline phosphatase of 148. Normal CBC except white count of 12.2. PTT is 26.9. Normal first set of cardiac enzymes except troponin 0.07. EKG showed normal sinus rhythm, left axis deviation, nonspecific ST-T changes and prolonged QT of 400 milliseconds. Chest x-ray in the ED showed bibasilar atelectasis. Right knee x-ray showed no acute abnormality.   IMPRESSION AND PLAN:   1.  Possible non-ST elevation myocardial infarction with typical non-reproducible chest pain and history of myocardial infarction  now with elevated troponin. We will consult cardiology. Obtain Myoview in the morning. We will start him on heparin drip without bolus. If his enzymes return negative, heparin can be stopped. We will start him on nitroglycerin and statin. Check lipid profile.  2.  Unstable angina. We will do serial cardiac enzymes, start him on aspirin and nitroglycerin, consult cardiology and get the Myoview in the morning if able.  3.  Acute renal failure. This could be chronic kidney disease, but I do not have any old baseline creatinines to compare with. We will hydrate him with intravenous fluids and recheck renal function.  4.  We will avoid any nephrotoxin. Hold his torsemide and ACE inhibitor.  5.  Diabetes mellitus. We will hold his home medication including insulin and metformin. Consider Myoview in the morning and he will be n.p.o. We will start him on sliding scale for now. His home medications can be restarted in the morning  once he is eating.  6.  Peripheral vascular disease and chronic lower extremity wound. I did not unwrap his Unna boots. We will get wound consult. Likely dry lesions on the leg. We will get a wound nurse to help [place] with the dressing. If needed, we will get a podiatry consult or vascular surgery consult.  7.  Elevated amylase likely of no significance. He does not have any symptoms in the abdomen and I doubt this is reflective of pancreatitis. We will recheck amylase in the morning.  8.  Hypomagnesemia. We will replete and recheck.  9.  History of myocardial infarction status post angioplasty. We will start him on aspirin and nitroglycerin and get cardiology to see him.  10.  We will get a physical therapy consultation along with occupational therapy as this This 81 year old demented patient may need more help at home or likely rehab placement if he qualifies.   CODE STATUS:  Full code.   TOTAL TIME TAKING CARE OF THIS PATIENT:  55 minutes.    ____________________________ Ellamae Sia.  Sherryll Burger, MD vss:si D: 08/04/2012 21:49:00 ET T: 08/04/2012 23:02:45 ET JOB#: [id number]  cc: Naimah Yingst S. Sherryll Burger, MD, <Dictator> Jillene Bucks. Arlana Pouch, MD  Ellamae Sia [facility] MD ELECTRONICALLY SIGNED 08/06/2012 14:59

## 2014-10-12 NOTE — Discharge Summary (Signed)
PATIENT NAME:  Shaun Davis, Shaun Davis MR#:  [id number] DATE OF BIRTH:  Jun 20, 1933  DATE OF ADMISSION:  08/04/2012 DATE OF DISCHARGE:  08/05/2012  DISCHARGE DIAGNOSES:  1.  Elevated cardiac enzymes, likely due to supply and demand ischemia, myocardial infarction ruled out.  2.  Chest pain, likely noncardiac, could be musculoskeletal in nature. 3.  Acute renal failure, likely prerenal, resolved with hydration.   SECONDARY DIAGNOSES:  1.  Diabetes.  2.  Hypertension.  3.  Hyperlipidemia.  4.  History of myocardial infarction, status post angioplasty.  5.  Peripheral vascular disease.  6.  Dementia.   CONSULTATION: Cardiology, Dr. Mariah MillingGollan.   PROCEDURES/RADIOLOGICAL DATA: Stress test on 02/14 was negative. Chest x-ray on 02/14 showed atelectasis. Chest x-ray on 02/13 showed mild atelectasis. Right knee x-ray on 02/13 showed no acute abnormality.   MAJOR LABORATORY PANEL: Urinalysis on admission was negative.   HISTORY AND SHORT HOSPITAL COURSE: The patient is a 81 year old male with above-mentioned medical problems, who was admitted for elevated cardiac enzymes thought to be due to supply and demand ischemia. He had  a cardiology consultation with Dr. Mariah MillingGollan, who recommended Myoview, which was performed and was negative. He also had minimal chest pain, which was thought to be noncardiac and felt to be musculoskeletal in nature. He was doing much better on 02/14 and was discharged home in stable condition. He was requested to go to rehab, however the patient refused and just wanted to go home and was very adamant about the same. He was discharged home on 02/14 in stable condition.   PHYSICAL EXAMINATION:  VITAL SIGNS: On the date of discharge, his vital signs were as follows: Temperature 98.2, heart rate 67 per minute, respirations 20 per minute, blood pressure 110/62 mmHg and he was saturating 95% to 96% on room air.   CARDIOVASCULAR: S1, S2 normal. No murmurs, rubs, or gallop.  LUNGS: Clear to  auscultation bilaterally. No wheezing, rales, rhonchi, or crepitation.  ABDOMEN: Soft, benign.  NEUROLOGIC: Nonfocal examination. All other physical examination remained at baseline.   DISCHARGE MEDICATIONS:  1.  Verapamil 200 mg p.o. at bedtime.  2.  Quinapril 20 mg p.o. daily.  3.  Metformin 500 mg 2 tablets p.o. b.i.d.  4.  Aspirin 325 mg p.o. daily.  5.  Tylenol 650 mg p.o. every 4 hours as needed.  6.  Lipitor 40 mg p.o. at bedtime.  7.  NovoLog Flex Pen 30 units subcutaneous daily with dinner.  8.  Donepezil 10 mg p.o. at bedtime.  9.  Insulin Levemir 50 units subcutaneous at bedtime.  10. Tramadol 50 mg p.o. every 6 hours as needed.  11. Torsemide 20 mg p.o. daily.  12. Namenda 5 mg p.o. daily. 13. NovoLog Flex Pen 40 units subcutaneous with lunch.  14. Insulin Levemir 40 units subcutaneous daily in the morning.   DISCHARGE DIET: Low sodium, 1800 ADA.   DISCHARGE ACTIVITY: As tolerated.   DISCHARGE INSTRUCTIONS AND FOLLOWUP: The patient was instructed to follow up with his primary care physician, Dr. Dewaine Oatsenny Tate in 1 to 2 weeks. He will need follow up with Dr. Mariah MillingGollan from cardiology in 2 to 4 weeks. Home health was resumed. His lower extremity had chronic wound for which Unna boot was requested, were already in place and was maintained by home health agency, for which he was followed. Please also resume aide services through [facility].   TOTAL TIME DISCHARGING THIS PATIENT: 55 minutes.   ____________________________ Ellamae SiaVipul S. Sherryll BurgerShah, MD vss:aw  D: 08/06/2012 14:54:55 ET T: 08/07/2012 07:02:17 ET JOB#: [id number]  cc: Acheron Sugg S. Sherryll Burger, MD, <Dictator> Jillene Bucks. Arlana Pouch, MD Antonieta Iba, MD Ellamae Sia [facility] MD ELECTRONICALLY SIGNED 08/08/2012 19:25

## 2014-10-13 NOTE — Discharge Summary (Signed)
PATIENT NAME:  Shaun Davis, Shaun Davis MR#:  [id number] DATE OF BIRTH:  [birth year]  DATE OF ADMISSION:  08/12/2013 DATE OF DISCHARGE:  08/23/2013  BRIEF HISTORY: Mr. [name] is a 81 year old gentleman admitted through the Emergency Room with history of abdominal pain, epigastric pain, nausea and vomiting. CT scan demonstrated a small amount of air and stranding around the duodenum. He had multiple medical problems including some mild dementia, myocardial infarction, severe lower extremity venous stasis disease, diabetes. He was admitted to the hospital and a nonoperative approach adopted. He was placed with nasogastric suction and continued to be observed over the next couple of days. He was treated with IV antibiotics and decompression. He continued to do relatively well with nonoperative care. The perforation appeared to be contained. However, on the evening of the 25th of February, he had increased abdominal pain with more nausea. We felt that we should repeat his CT scan and he underwent a followup scan that day. The scan did demonstrate 2 large fluid collections. He felt that because of his ongoing symptoms, he should undergo surgery. He was taken to the operating room that evening where he underwent a laparotomy and Graham patch closure. He was maintained in the intensive care unit postoperatively on a ventilator for 48 hours and then extubated and moved to the floor. He has continued to improve over the last 72 hours. He is tolerating a regular diet. His drainage from his JP drain has decreased and is now less than 400 mL a day. His wounds look good with no sign of any infection. He is discharged to rehab today to be followed in the office in 7 to 10 days' time.   DISCHARGE MEDICATIONS: Include verapamil 200 mg once a day, aspirin 325 mg once a day, torsemide 20 mg once a day, metformin 500 mg twice a day, atorvastatin 40 mg once a day, NovoLog 40 units at lunch and 20 units in the evening, donepezil 10 mg  once a day, Levemir 40 units once in the morning and 15 units at night, Namenda 1 tablet b.i.d., Tylenol 325, 2 tablets every 4 to 6 hours p.r.n. and quinapril 20 mg once a day in the evening.   DISCHARGE DIAGNOSIS: Perforated duodenal ulcer.   SURGERY: Cheree DittoGraham closure, laparotomy.  ____________________________ Quentin Orealph L. Ely III, MD rle:dmm D: 08/23/2013 09:30:23 ET T: 08/23/2013 09:54:28 ET JOB#: [id number]  cc: Quentin Orealph L. Ely III, MD, <Dictator> Quentin OreALPH L ELY MD ELECTRONICALLY SIGNED 08/25/2013 12:29

## 2014-10-13 NOTE — H&P (Signed)
PATIENT NAME:  Shaun Davis, Shaun Davis MR#:  [id number] DATE OF BIRTH:  [birth year]  DATE OF ADMISSION:  08/12/2013  ATTENDING PHYSICIAN: Cristal Deerhristopher A. Johnatan Baskette, MD  REASON FOR ADMISSION: One day of epigastric chest pain, history of nausea and vomiting a week ago, small air collections and stranding around duodenum.   HISTORY OF PRESENT ILLNESS: Shaun Davis is a pleasant 81 year old demented gentleman with history of diabetes, MI, angioplasty and dementia, who presented after waking up this morning with epigastric/chest pain. He had 1 episode of nausea and vomiting a week ago. His wife, who was with him, brought him to the Emergency Room. He has had workup which shows a leukocytosis of 9.8 with 80% neutrophils and a CT scan which shows small areas of intra-abdominal air as well as stranding around his duodenum. He had been doing fine up until a week ago, where he had 1 episode of nausea and vomiting. He otherwise has multiple bowel movements every day, which sounds like it has been going on for more than a year. No current abdominal pain. No headaches, fevers, chills, night sweats, shortness of breath, cough.   PAST MEDICAL HISTORY:  1. Dementia.  2. Peripheral vascular disease.  3. Hypertension.  4. History of MI.  5. History of diabetes.  6. History of angioplasty.   HOME MEDICATIONS: Verapamil, Tylenol, tramadol, torsemide, quinapril, NovoLog, Namenda, metformin, Levemir, donepezil, atorvastatin, aspirin.   ALLERGIES: PENICILLIN.   SOCIAL HISTORY: No tobacco. No alcohol. Lives at home with his wife.   FAMILY HISTORY: Mother with a history of MI.   REVIEW OF SYSTEMS: A 12-point review of systems was obtained as best possible with the patient's mental status. Pertinent positives and negatives as above.   PHYSICAL EXAMINATION:  VITAL SIGNS: Temperature 97.7, pulse 90, blood pressure 118/54, respirations 18, 96% on room air.  GENERAL: In no acute distress. Interactive, appears oriented.  HEAD:  Normocephalic, atraumatic.  EYES: No scleral icterus. No conjunctivitis.  FACE: No obvious facial trauma. Normal external nose. Normal external ears.  CHEST: Lungs clear to auscultation. Moving air well.  HEART: Regular rate and rhythm. No murmurs, rubs or gallops.  ABDOMEN: Soft, nontender, nondistended.  EXTREMITIES: Moves all extremities well. Strength 5 out of 5.  NEUROLOGIC: Cranial nerves II through XII grossly intact.   LABORATORY DATA: White cell count 9.8, hemoglobin 13.5, hematocrit 40.1, platelets are 248, neutrophils are 80%. Albumin is 3.3, alkaline phosphatase is 152.   IMAGING: CT scan shows small areas of loculated gas. No significant free fluid or fluid collections in the abdomen and pelvis. Some stranding around pylorus of abdomen and proximal duodenum.   ASSESSMENT AND PLAN: Shaun Davis is a pleasant 81 year old male who presents with multiple medical issues and acute-onset abdominal pain. He does appear to have a possible ruptured ulcer, but does not have peritonitis, is not critically ill. Will plan on IV antibiotics. No need for emergent surgery, and feel that his perforation has probably healed at this point. Will continue to follow closely. Recommend n.p.o., IV antibiotics and resuscitation.   ____________________________ Si Raiderhristopher A. Alphonsine Minium, MD cal:lb D: 08/12/2013 11:51:41 ET T: 08/12/2013 12:17:28 ET JOB#: [id number]  cc: Cristal Deerhristopher A. Carlen Fils, MD, <Dictator> Jarvis NewcomerHRISTOPHER A Lotus Santillo MD ELECTRONICALLY SIGNED 08/13/2013 13:13

## 2014-10-13 NOTE — Consult Note (Signed)
PATIENT NAME:  Shaun Davis, Shaun Davis MR#:  [id number] DATE OF BIRTH:  1932/09/10  DATE OF CONSULTATION:  08/12/2013  REFERRING PHYSICIAN:  Cristal Deer A. Lundquist, MD, Surgery CONSULTING PHYSICIAN:  Herschell Dimes. Renae Gloss, MD PRIMARY CARE PHYSICIAN:  Jillene Bucks. Arlana Pouch, MD  CHIEF COMPLAINT: Abdominal pain.   REASON FOR CONSULTATION: Medical management.   HISTORY OF PRESENT ILLNESS: This is a 81 year old man who was admitted to the hospital today with 10 out of 10 abdominal pain, woke him up from sleep. He thought he was having a heart attack, asked his wife to call 911. In the ER, he had a CT scan of the abdomen and pelvis that showed pneumoperitoneum, findings consistent with perforated viscus; a ruptured peptic ulcer is suspected. The patient was hemodynamically stable and had no white count. Dr. Juliann Pulse, surgery, admitted the patient and is planning on doing conservative management with IV fluids and antibiotics. Hospitalist services were contacted for further evaluation. The patient is not the best historian, most history from old chart and from the wife.   PAST MEDICAL HISTORY:  1.  Diabetes.  2.  Chronic leg ulcerations and edema.  3.  Hypertension.  4.  Hyperlipidemia.  5.  History of coronary artery disease, status post angioplasty in 1986.  6.  Peripheral vascular disease.  7.  Dementia.   PAST SURGICAL HISTORY: None.   ALLERGIES: PENICILLIN, WHICH CAUSED A RASH.   FAMILY HISTORY: Mother died of a heart attack. Father died in a nursing home, unknown cause.   SOCIAL HISTORY: No smoking. No alcohol. No IV drugs. Lives at home with his wife. He did work at [employer], case assembly, in the past.   MEDICATIONS: As per Restaurant manager, fast food include aspirin 325 mg daily, atorvastatin 40 mg at bedtime, benazepril 10 mg daily, Levemir 40 units subcutaneous injection in the morning and 50 units subcutaneous injection in the p.m., metformin 500 mg twice a day, Namenda 1 tablet twice a day, NovoLog FlexPen 40  units at lunch and 20 units at supper, quinapril 20 mg in the evening, torsemide 20 mg daily, Tylenol 650 mg 2 to 4 times a day as needed for pain, Tylenol 1000 mg 2 to 4 times a day as needed for pain, verapamil ER 200 mg at bedtime.   REVIEW OF SYSTEMS:    CONSTITUTIONAL: No fever, chills, or sweats. No weight gain. No weight loss.  EYES: He does wear glasses.  EARS, NOSE, MOUTH, AND THROAT: Decreased hearing. Positive for runny nose. No sore throat.  CARDIOVASCULAR: Positive for chest pain.  RESPIRATORY: Occasional shortness of breath. Occasional cough. No sputum. No hemoptysis.  GASTROINTESTINAL: Nausea, vomiting last week. Abdominal pain this morning. Diarrhea last week. No bright red blood per rectum. No melena.  GENITOURINARY: No burning on urination. No hematuria.  MUSCULOSKELETAL: Positive for leg pain.  INTEGUMENTARY: Chronic lower extremity ulcers.  NEUROLOGIC: No fainting or blackouts.  PSYCHIATRIC: Positive for some anxiety.  ENDOCRINE: No thyroid problems.  HEMATOLOGIC AND LYMPHATIC: No anemia. No easy bruising or bleeding.   PHYSICAL EXAMINATION:  VITAL SIGNS: Temperature 97.7, pulse 78, respirations 18, blood pressure 122/66, pulse oximetry 97% on room air.  GENERAL: No respiratory distress, lying flat in bed.  EYES: Conjunctivae and lids normal. Pupils equal, round, and reactive to light. Extraocular muscles intact. No nystagmus.  EARS, NOSE, MOUTH, AND THROAT: Tympanic membranes: No erythema. Nasal mucosa: No erythema. Throat: No erythema, no exudate seen. Lips and gums: No lesions.  NECK: No JVD. No bruits. No lymphadenopathy. No thyromegaly.  No thyroid nodules palpated.  LUNGS: Clear to auscultation. No use of accessory muscles to breathe. No rhonchi, rales, or wheeze heard.  CARDIOVASCULAR: S1, S2 normal. No gallops, rubs, or murmurs heard. Carotid upstroke 2+ bilaterally, 3+ edema of lower extremity.  ABDOMEN: Tense, tender throughout the entire abdomen, some guarding,  hypoactive bowel sounds.  LYMPHATIC: No lymph nodes in the neck.  MUSCULOSKELETAL: Shows 3+ edema. No clubbing. No cyanosis.  SKIN: Right lower extremity has numerous superficial ulcerations, surrounding erythema. Left lower extremity anterior ulceration with some surrounding erythema.  NEUROLOGIC: Cranial nerves II through XII grossly intact. Deep tendon reflexes 1+ bilateral lower extremity.  PSYCHIATRIC: Alert to person and place.   LABORATORY, DIAGNOSTIC, AND RADIOLOGICAL DATA: CT scan of the abdomen and pelvis showed a pneumoperitoneum, findings consistent with perforated viscus; a ruptured peptic ulcer is suspected. Urinalysis negative. Troponin negative. White blood cell count 9.8, hemoglobin and hematocrit 13.5 and 40.1, platelet count of 248. Glucose 156, BUN 14, creatinine 1.17, sodium 139, potassium 4.0, chloride 105, CO2 of 26, calcium 9.0. Liver function tests: Alkaline phosphatase 152, ALT 15, AST 22. Lipase 242. Flat and upright showed no abnormalities. EKG showed a sinus rhythm with PVCs, PACs, left axis deviation, septal infarct.   ASSESSMENT AND PLAN: 1.  Severe abdominal pain, likely ruptured ulcer with pneumoperitoneum: Admitted to the surgical service. They want to try conservative management with antibiotics, Cipro and Flagyl, and IV fluid hydration. NPO except for meds. No contraindications to surgery if needed. Will increase Protonix to 40 mg IV q.12 hours at this point.  2.  Hypertension: Will continue blood pressure medications.  3.  Chronic lower extremity ulceration: Will need wound care as outpatient. Will get the wound care nurse to see while here. Currently will cover with a nonstick dressing.  4.  Hyperlipidemia: Continue Lipitor.  5.  History of myocardial infarction: Hold aspirin at this point since surgery may be needed.  6.  Diabetes: Will just put on sliding scale and hold insulin and metformin at this point.  7.  Dementia: On Namenda and Aricept.  8.  Peripheral  vascular disease: Hold aspirin at this point.  9.  Family wish to be a DO NOT RESUSCITATE. Will change code status to DO NOT RESUSCITATE.   TIME SPENT ON CONSULTATION: 55 minutes.   ____________________________ Herschell Dimesichard J. Renae GlossWieting, MD rjw:jcm D: 08/12/2013 17:46:51 ET T: 08/12/2013 18:07:45 ET JOB#: [id number]  cc: Herschell Dimesichard J. Renae GlossWieting, MD, <Dictator> Jillene Bucksenny C. Arlana Pouchate, MD Salley ScarletICHARD J Latiana Tomei MD ELECTRONICALLY SIGNED 08/13/2013 16:10

## 2014-10-13 NOTE — Op Note (Signed)
PATIENT NAME:  Shaun Davis, Shaun Davis MR#:  [id number] DATE OF BIRTH:  [birth year]  DATE OF PROCEDURE:  08/16/2013  PREOPERATIVE DIAGNOSIS: Perforated duodenal ulcer.   POSTOPERATIVE DIAGNOSIS: Perforated duodenal ulcer.   PROCEDURES PERFORMED:  1.  Diagnostic laparoscopy.  2.  Laparotomy.  3.  Cheree DittoGraham patch repair of perforated prepyloric channel ulcer.  4.  Peritoneal lavage.   SURGEON: Natale LayMark Kaysie Michelini, M.D.   ASSISTANExcell Seltzer: Cooper.   ANESTHESIA: General oroendotracheal.   FINDINGS: 3 mm perforated ulcer in the prepyloric space with significant biliary contamination of the upper abdomen.   SPECIMENS: None.   ESTIMATED BLOOD LOSS: Minimal.   DRAINS: 19 mm Blake drain overlying the omental patch.   DESCRIPTION OF PROCEDURE: With informed consent, supine position, general oral endotracheal anesthesia, the patient's abdomen was widely prepped and draped with ChloraPrep solution. Timeout was observed. An 11 mm bladeless trocar was placed through an open technique through an infraumbilical transversely oriented skin incision. Pneumoperitoneum was established. Diagnostic laparoscopy demonstrated inflammatory mass within the upper abdomen. As such a midline skin incision was then fashioned, after confirmation of the diagnosis, by dividing the skin with scalpel and musculofascial layers with electrocautery and scalpel. A self-retaining abdominal retractor was placed.   There was a large amount of bile within the upper abdomen and along the liver as well as in the left upper quadrant. This was all aspirated out.   The omentum had formed itself to a lightly adherent biological patch to the area of contamination. This was all taken down with blunt technique easily. A figure-of-eight #0 silk suture was then placed in the ulcer. This was lightly tied down. The area was very indurated. A tongue of omentum was then placed over the repair securing it with 2-0 silk suture. A 19 mm Blake drain was then directed into the  space and exited the right lateral abdomen. Drain site was secured with nylon suture. The abdomen at this point was copiously irrigated with warm normal saline, several liters, aspirated dry. The midline fascia was then closed with running #1 looped PDS from the extremes of the wounds. The infraumbilical fascial defect was closed with a figure-of-eight #0 Vicryl suture. Subcutaneous tissues were then irrigated. Skin edges were reapproximated utilizing a skin stapler.   Attention was then turned to placement of the central line. Several attempts in the subclavian position on the right side were unsuccessful. A right femoral triple lumen catheter was then placed under sterile Seldinger technique without complication on first pass. The patient was then taken to the recovery room and subsequently to the intensive care unit under stable conditions.   ____________________________  Redge GainerMark A. Egbert GaribaldiBird, MD mab:sb D: 08/17/2013 07:07:00 ET T: 08/17/2013 08:30:13 ET JOB#: [id number]  cc: Loraine LericheMark A. Egbert GaribaldiBird, MD, <Dictator> Cecily Lawhorne A Tinzley Dalia MD ELECTRONICALLY SIGNED 08/23/2013 11:03

## 2017-05-23 ENCOUNTER — Emergency Department: Payer: Medicare Other

## 2017-05-23 ENCOUNTER — Other Ambulatory Visit: Payer: Self-pay

## 2017-05-23 ENCOUNTER — Encounter: Payer: Self-pay | Admitting: Emergency Medicine

## 2017-05-23 ENCOUNTER — Observation Stay
Admission: EM | Admit: 2017-05-23 | Discharge: 2017-05-25 | Disposition: A | Discharge status: Home Health | Payer: Medicare Other | Attending: Internal Medicine | Admitting: Internal Medicine

## 2017-05-23 DIAGNOSIS — I252 Old myocardial infarction: Secondary | ICD-10-CM | POA: Insufficient documentation

## 2017-05-23 DIAGNOSIS — K4020 Bilateral inguinal hernia, without obstruction or gangrene, not specified as recurrent: Secondary | ICD-10-CM | POA: Insufficient documentation

## 2017-05-23 DIAGNOSIS — I4891 Unspecified atrial fibrillation: Secondary | ICD-10-CM | POA: Insufficient documentation

## 2017-05-23 DIAGNOSIS — Z880 Allergy status to penicillin: Secondary | ICD-10-CM | POA: Diagnosis not present

## 2017-05-23 DIAGNOSIS — E785 Hyperlipidemia, unspecified: Secondary | ICD-10-CM | POA: Diagnosis not present

## 2017-05-23 DIAGNOSIS — L03116 Cellulitis of left lower limb: Secondary | ICD-10-CM | POA: Diagnosis not present

## 2017-05-23 DIAGNOSIS — M545 Low back pain: Secondary | ICD-10-CM | POA: Diagnosis not present

## 2017-05-23 DIAGNOSIS — E870 Hyperosmolality and hypernatremia: Secondary | ICD-10-CM | POA: Insufficient documentation

## 2017-05-23 DIAGNOSIS — R55 Syncope and collapse: Secondary | ICD-10-CM | POA: Diagnosis present

## 2017-05-23 DIAGNOSIS — Z7982 Long term (current) use of aspirin: Secondary | ICD-10-CM | POA: Insufficient documentation

## 2017-05-23 DIAGNOSIS — S300XXA Contusion of lower back and pelvis, initial encounter: Secondary | ICD-10-CM | POA: Diagnosis not present

## 2017-05-23 DIAGNOSIS — F0390 Unspecified dementia without behavioral disturbance: Secondary | ICD-10-CM | POA: Diagnosis not present

## 2017-05-23 DIAGNOSIS — N400 Enlarged prostate without lower urinary tract symptoms: Secondary | ICD-10-CM | POA: Diagnosis not present

## 2017-05-23 DIAGNOSIS — N401 Enlarged prostate with lower urinary tract symptoms: Secondary | ICD-10-CM | POA: Insufficient documentation

## 2017-05-23 DIAGNOSIS — R21 Rash and other nonspecific skin eruption: Secondary | ICD-10-CM | POA: Insufficient documentation

## 2017-05-23 DIAGNOSIS — I10 Essential (primary) hypertension: Secondary | ICD-10-CM | POA: Insufficient documentation

## 2017-05-23 DIAGNOSIS — Z794 Long term (current) use of insulin: Secondary | ICD-10-CM | POA: Diagnosis not present

## 2017-05-23 DIAGNOSIS — M7989 Other specified soft tissue disorders: Secondary | ICD-10-CM | POA: Diagnosis not present

## 2017-05-23 DIAGNOSIS — E119 Type 2 diabetes mellitus without complications: Secondary | ICD-10-CM | POA: Insufficient documentation

## 2017-05-23 DIAGNOSIS — M889 Osteitis deformans of unspecified bone: Secondary | ICD-10-CM | POA: Diagnosis not present

## 2017-05-23 DIAGNOSIS — W19XXXA Unspecified fall, initial encounter: Secondary | ICD-10-CM | POA: Diagnosis not present

## 2017-05-23 DIAGNOSIS — K8020 Calculus of gallbladder without cholecystitis without obstruction: Secondary | ICD-10-CM | POA: Insufficient documentation

## 2017-05-23 DIAGNOSIS — M549 Dorsalgia, unspecified: Secondary | ICD-10-CM | POA: Insufficient documentation

## 2017-05-23 DIAGNOSIS — R748 Abnormal levels of other serum enzymes: Secondary | ICD-10-CM | POA: Insufficient documentation

## 2017-05-23 DIAGNOSIS — R531 Weakness: Secondary | ICD-10-CM | POA: Diagnosis not present

## 2017-05-23 DIAGNOSIS — R918 Other nonspecific abnormal finding of lung field: Secondary | ICD-10-CM | POA: Insufficient documentation

## 2017-05-23 DIAGNOSIS — K5730 Diverticulosis of large intestine without perforation or abscess without bleeding: Secondary | ICD-10-CM | POA: Insufficient documentation

## 2017-05-23 DIAGNOSIS — R7989 Other specified abnormal findings of blood chemistry: Secondary | ICD-10-CM | POA: Insufficient documentation

## 2017-05-23 DIAGNOSIS — I739 Peripheral vascular disease, unspecified: Secondary | ICD-10-CM | POA: Diagnosis not present

## 2017-05-23 DIAGNOSIS — R609 Edema, unspecified: Secondary | ICD-10-CM

## 2017-05-23 DIAGNOSIS — I959 Hypotension, unspecified: Secondary | ICD-10-CM | POA: Insufficient documentation

## 2017-05-23 DIAGNOSIS — I493 Ventricular premature depolarization: Secondary | ICD-10-CM | POA: Diagnosis not present

## 2017-05-23 DIAGNOSIS — R778 Other specified abnormalities of plasma proteins: Secondary | ICD-10-CM

## 2017-05-23 DIAGNOSIS — Z87891 Personal history of nicotine dependence: Secondary | ICD-10-CM | POA: Diagnosis not present

## 2017-05-23 DIAGNOSIS — Z79899 Other long term (current) drug therapy: Secondary | ICD-10-CM | POA: Insufficient documentation

## 2017-05-23 DIAGNOSIS — R0600 Dyspnea, unspecified: Secondary | ICD-10-CM | POA: Insufficient documentation

## 2017-05-23 DIAGNOSIS — Z66 Do not resuscitate: Secondary | ICD-10-CM | POA: Insufficient documentation

## 2017-05-23 DIAGNOSIS — L03115 Cellulitis of right lower limb: Secondary | ICD-10-CM | POA: Insufficient documentation

## 2017-05-23 LAB — PHOSPHORUS: Phosphorus: 2.4 mg/dL — ABNORMAL LOW (ref 2.5–4.6)

## 2017-05-23 LAB — URINALYSIS, COMPLETE (UACMP) WITH MICROSCOPIC
BILIRUBIN URINE: NEGATIVE
Bacteria, UA: NONE SEEN
GLUCOSE, UA: NEGATIVE mg/dL
KETONES UR: NEGATIVE mg/dL
LEUKOCYTES UA: NEGATIVE
NITRITE: NEGATIVE
PH: 5 (ref 5.0–8.0)
PROTEIN: NEGATIVE mg/dL
Specific Gravity, Urine: 1.015 (ref 1.005–1.030)

## 2017-05-23 LAB — BASIC METABOLIC PANEL
Anion gap: 11 (ref 5–15)
BUN: 30 mg/dL — ABNORMAL HIGH (ref 6–20)
CHLORIDE: 106 mmol/L (ref 101–111)
CO2: 21 mmol/L — ABNORMAL LOW (ref 22–32)
CREATININE: 1.35 mg/dL — AB (ref 0.61–1.24)
Calcium: 8.9 mg/dL (ref 8.9–10.3)
GFR, EST AFRICAN AMERICAN: 55 mL/min — AB (ref >60)
GFR, EST NON AFRICAN AMERICAN: 47 mL/min — AB (ref >60)
Glucose, Bld: 140 mg/dL — ABNORMAL HIGH (ref 65–99)
POTASSIUM: 4.5 mmol/L (ref 3.5–5.1)
SODIUM: 138 mmol/L (ref 135–145)

## 2017-05-23 LAB — TROPONIN I
Troponin I: 0.03 ng/mL (ref <0.03)
Troponin I: 0.04 ng/mL (ref <0.03)
Troponin I: 0.05 ng/mL (ref <0.03)

## 2017-05-23 LAB — CBC
HEMATOCRIT: 36.5 % — AB (ref 40.0–52.0)
Hemoglobin: 12 g/dL — ABNORMAL LOW (ref 13.0–18.0)
MCH: 30 pg (ref 26.0–34.0)
MCHC: 32.8 g/dL (ref 32.0–36.0)
MCV: 91.5 fL (ref 80.0–100.0)
PLATELETS: 170 10*3/uL (ref 150–440)
RBC: 4 MIL/uL — AB (ref 4.40–5.90)
RDW: 15.4 % — ABNORMAL HIGH (ref 11.5–14.5)
WBC: 11.1 10*3/uL — AB (ref 3.8–10.6)

## 2017-05-23 LAB — MAGNESIUM: Magnesium: 1.4 mg/dL — ABNORMAL LOW (ref 1.7–2.4)

## 2017-05-23 LAB — GLUCOSE, CAPILLARY
GLUCOSE-CAPILLARY: 185 mg/dL — AB (ref 65–99)
Glucose-Capillary: 118 mg/dL — ABNORMAL HIGH (ref 65–99)

## 2017-05-23 LAB — BRAIN NATRIURETIC PEPTIDE: B NATRIURETIC PEPTIDE 5: 176 pg/mL — AB (ref 0.0–100.0)

## 2017-05-23 LAB — CK: Total CK: 298 U/L (ref 49–397)

## 2017-05-23 MED ORDER — ONDANSETRON HCL 4 MG/2ML IJ SOLN: 4.0000 mg | Freq: Four times a day (QID) | INTRAMUSCULAR | Status: DC | PRN

## 2017-05-23 MED ORDER — MAGNESIUM SULFATE 4 GM/100ML IV SOLN
4.0000 g | Freq: Once | INTRAVENOUS | Status: AC
  Administered 2017-05-23: 4 g via INTRAVENOUS
  Filled 2017-05-23: qty 100

## 2017-05-23 MED ORDER — QUINAPRIL HCL 10 MG PO TABS
20.0000 mg | ORAL_TABLET | Freq: Every day | ORAL | Status: DC
  Filled 2017-05-23: qty 2

## 2017-05-23 MED ORDER — MEMANTINE HCL 10 MG PO TABS
10.0000 mg | ORAL_TABLET | Freq: Two times a day (BID) | ORAL | Status: DC
  Administered 2017-05-24 – 2017-05-25 (×3): 10 mg via ORAL
  Filled 2017-05-23 (×4): qty 1

## 2017-05-23 MED ORDER — DONEPEZIL HCL 5 MG PO TABS
10.0000 mg | ORAL_TABLET | Freq: Every day | ORAL | Status: DC
  Administered 2017-05-24: 10 mg via ORAL
  Filled 2017-05-23: qty 1
  Filled 2017-05-23 (×2): qty 2

## 2017-05-23 MED ORDER — INSULIN ASPART 100 UNIT/ML ~~LOC~~ SOLN
4.0000 [IU] | Freq: Two times a day (BID) | SUBCUTANEOUS | Status: DC
  Administered 2017-05-23: 4 [IU] via SUBCUTANEOUS
  Filled 2017-05-23: qty 1

## 2017-05-23 MED ORDER — VERAPAMIL HCL ER 100 MG PO CP24
100.0000 mg | ORAL_CAPSULE | Freq: Every day | ORAL | Status: DC
  Filled 2017-05-23 (×2): qty 1

## 2017-05-23 MED ORDER — TRAMADOL HCL 50 MG PO TABS
50.0000 mg | ORAL_TABLET | Freq: Four times a day (QID) | ORAL | Status: DC | PRN
  Administered 2017-05-23 – 2017-05-24 (×2): 50 mg via ORAL
  Filled 2017-05-23 (×2): qty 1

## 2017-05-23 MED ORDER — FENTANYL CITRATE (PF) 100 MCG/2ML IJ SOLN
12.5000 ug | Freq: Once | INTRAMUSCULAR | Status: AC
  Administered 2017-05-23: 12.5 ug via INTRAVENOUS
  Filled 2017-05-23: qty 2

## 2017-05-23 MED ORDER — INSULIN ASPART 100 UNIT/ML ~~LOC~~ SOLN: 0.0000 [IU] | Freq: Every day | SUBCUTANEOUS | Status: DC

## 2017-05-23 MED ORDER — ACETAMINOPHEN 325 MG PO TABS
650.0000 mg | ORAL_TABLET | Freq: Three times a day (TID) | ORAL | Status: DC | PRN
  Administered 2017-05-23: 650 mg via ORAL
  Filled 2017-05-23: qty 2

## 2017-05-23 MED ORDER — ATORVASTATIN CALCIUM 20 MG PO TABS
40.0000 mg | ORAL_TABLET | Freq: Every day | ORAL | Status: DC
  Administered 2017-05-23 – 2017-05-25 (×3): 40 mg via ORAL
  Filled 2017-05-23 (×3): qty 2

## 2017-05-23 MED ORDER — PANTOPRAZOLE SODIUM 40 MG PO TBEC
40.0000 mg | DELAYED_RELEASE_TABLET | Freq: Every day | ORAL | Status: DC
  Administered 2017-05-23 – 2017-05-25 (×3): 40 mg via ORAL
  Filled 2017-05-23 (×3): qty 1

## 2017-05-23 MED ORDER — INSULIN ASPART 100 UNIT/ML ~~LOC~~ SOLN
0.0000 [IU] | Freq: Three times a day (TID) | SUBCUTANEOUS | Status: DC
  Administered 2017-05-23: 18:00:00 2 [IU] via SUBCUTANEOUS
  Filled 2017-05-23: qty 1

## 2017-05-23 MED ORDER — ONDANSETRON HCL 4 MG PO TABS: 4.0000 mg | ORAL_TABLET | Freq: Four times a day (QID) | ORAL | Status: DC | PRN

## 2017-05-23 MED ORDER — INSULIN ASPART 100 UNIT/ML ~~LOC~~ SOLN: 6.0000 [IU] | Freq: Two times a day (BID) | SUBCUTANEOUS | Status: DC

## 2017-05-23 MED ORDER — SODIUM CHLORIDE 0.9 % IV BOLUS (SEPSIS)
500.0000 mL | Freq: Once | INTRAVENOUS | Status: AC
  Administered 2017-05-23: 500 mL via INTRAVENOUS

## 2017-05-23 MED ORDER — SODIUM CHLORIDE 0.9% FLUSH
3.0000 mL | Freq: Two times a day (BID) | INTRAVENOUS | Status: DC
  Administered 2017-05-23 – 2017-05-25 (×4): 3 mL via INTRAVENOUS

## 2017-05-23 MED ORDER — FENTANYL CITRATE (PF) 100 MCG/2ML IJ SOLN
INTRAMUSCULAR | Status: AC
  Filled 2017-05-23: qty 2

## 2017-05-23 MED ORDER — ENOXAPARIN SODIUM 40 MG/0.4ML ~~LOC~~ SOLN
40.0000 mg | SUBCUTANEOUS | Status: DC
  Administered 2017-05-23: 16:00:00 40 mg via SUBCUTANEOUS
  Filled 2017-05-23: qty 0.4

## 2017-05-23 MED ORDER — INSULIN DETEMIR 100 UNIT/ML ~~LOC~~ SOLN
15.0000 [IU] | Freq: Every day | SUBCUTANEOUS | Status: DC
  Administered 2017-05-23 – 2017-05-24 (×2): 15 [IU] via SUBCUTANEOUS
  Filled 2017-05-23 (×3): qty 0.15

## 2017-05-23 MED ORDER — ASPIRIN 325 MG PO TABS
325.0000 mg | ORAL_TABLET | Freq: Every day | ORAL | Status: DC
  Administered 2017-05-23 – 2017-05-25 (×2): 325 mg via ORAL
  Filled 2017-05-23 (×4): qty 1

## 2017-05-23 MED ORDER — INSULIN DETEMIR 100 UNIT/ML ~~LOC~~ SOLN
23.0000 [IU] | Freq: Every day | SUBCUTANEOUS | Status: DC
  Filled 2017-05-23: qty 0.23

## 2017-05-23 MED ORDER — K PHOS MONO-SOD PHOS DI & MONO 155-852-130 MG PO TABS
500.0000 mg | ORAL_TABLET | ORAL | Status: AC
  Administered 2017-05-23 (×2): 500 mg via ORAL
  Filled 2017-05-23 (×2): qty 2

## 2017-05-23 MED ORDER — FENTANYL CITRATE (PF) 100 MCG/2ML IJ SOLN
12.5000 ug | Freq: Once | INTRAMUSCULAR | Status: AC
  Administered 2017-05-23: 12.5 ug via INTRAVENOUS

## 2017-05-23 MED ORDER — TORSEMIDE 20 MG PO TABS
20.0000 mg | ORAL_TABLET | Freq: Every day | ORAL | Status: DC
  Administered 2017-05-23: 20 mg via ORAL
  Filled 2017-05-23: qty 1

## 2017-05-23 MED ORDER — CLINDAMYCIN PHOSPHATE 600 MG/50ML IV SOLN
600.0000 mg | Freq: Three times a day (TID) | INTRAVENOUS | Status: DC
  Administered 2017-05-23 – 2017-05-25 (×7): 600 mg via INTRAVENOUS
  Filled 2017-05-23 (×10): qty 50

## 2017-05-23 MED ORDER — POLYETHYLENE GLYCOL 3350 17 G PO PACK: 17.0000 g | PACK | Freq: Every day | ORAL | Status: DC | PRN

## 2017-05-23 NOTE — Progress Notes (Signed)
Pharmacist - Prescriber Communication  Enoxaparin dose modified from 30 mg subcutaneously once daily to 40 mg subcutaneously once daily due to CrCl > 30 mL/min and ABW > 45 kg.  Arjun Hard A. Trainerookson, VermontPharm.D., BCPS Clinical Pharmacist 05/23/2017 15:52

## 2017-05-23 NOTE — H&P (Signed)
[facility] [place] [facility]   PATIENT NAME: Shaun Davis    MR#:  [id number]  DATE OF BIRTH:  [birth year]  DATE OF ADMISSION:  05/23/2017  PRIMARY CARE PHYSICIAN: Jaclyn Shaggyate, Denny C, MD   REQUESTING/REFERRING PHYSICIAN: 05/23/17  CHIEF COMPLAINT:  UNCONSCIOUS  HISTORY OF PRESENT ILLNESS:  Shaun Grillshomas Rideaux  is a 83 y.o. male with a known history of dementia, isn't requiring diabetes mellitus, peripheral vascular disease, essential hypertension and hyperlipidemia is brought into the ED after patient had 2 syncopal episodes today. Patient chronically sleeps in the chair, and today morning wife found him on the floor at around 7:30 AM and doesn't know the down time. Patient could not recall being dizzy and falling onto the floor. EMS came but patient refused to come to the hospital. Patient has sustained another fall at around 9:00 and wife insisted him to come to the hospital. CT head is negative. Initial troponin at 0.04. Patient denies any dizziness or chest pain, in fact he is a poor historian from his dementia.Marland Kitchen. He has lower extremity swelling and redness, according to the wife the patient has peripheral vascular disease is redness and worsening of the swelling is new. EKG with multiple PVCs  PAST MEDICAL HISTORY:   Past Medical History:  Diagnosis Date  . Dementia   . Diabetes mellitus without complication (HCC)   . History of MI (myocardial infarction)   . Hyperlipidemia   . Hypertension   . PVD (peripheral vascular disease) (HCC)     PAST SURGICAL HISTOIRY:   Past Surgical History:  Procedure Laterality Date  . CORONARY ANGIOPLASTY    . HERNIA REPAIR      SOCIAL HISTORY:   Social History   Tobacco Use  . Smoking status: Former Smoker  Substance Use Topics  . Alcohol use: No    FAMILY HISTORY:  No family history on file.  DRUG ALLERGIES:   Allergies  Allergen Reactions  . Penicillins Rash    Has patient had a PCN reaction causing  immediate rash, facial/tongue/throat swelling, SOB or lightheadedness with hypotension: Yes Has patient had a PCN reaction causing severe rash involving mucus membranes or skin necrosis: No Has patient had a PCN reaction that required hospitalization: No Has patient had a PCN reaction occurring within the last 10 years: No If all of the above answers are "NO", then may proceed with Cephalosporin use.    REVIEW OF SYSTEMS:  Patient has chronic dementia and poor historian  MEDICATIONS AT HOME:   Prior to Admission medications   Medication Sig Start Date End Date Taking? Authorizing Provider  aspirin 325 MG tablet Take 325 mg by mouth daily.   Yes [provider]  atorvastatin (LIPITOR) 40 MG tablet Take 40 mg by mouth daily.   Yes [provider]  donepezil (ARICEPT) 10 MG tablet Take 10 mg by mouth at bedtime as needed.   Yes [provider]  insulin aspart (NOVOLOG FLEXPEN) 100 UNIT/ML injection Inject 6 Units into the skin 2 (two) times daily.    Yes [provider]  Insulin Detemir (LEVEMIR) 100 UNIT/ML Pen Inject 23 Units into the skin at bedtime.    Yes [provider]  memantine (NAMENDA) 10 MG tablet Take 1 tablet by mouth 2 (two) times daily. 04/26/17  Yes [provider]  metFORMIN (GLUCOPHAGE) 500 MG tablet Take 500 mg by mouth 2 (two) times daily with a meal.   Yes [provider]  pantoprazole (PROTONIX) 40  MG tablet Take 1 tablet by mouth daily. 05/11/17  Yes [provider]  quinapril (ACCUPRIL) 20 MG tablet Take 20 mg by mouth at bedtime.   Yes [provider]  torsemide (DEMADEX) 20 MG tablet Take 20 mg by mouth daily.   Yes [provider]  verapamil (VERELAN) 100 MG 24 hr capsule Take 100 mg by mouth at bedtime.    Yes [provider]  acetaminophen (TYLENOL) 650 MG CR tablet Take 650 mg by mouth every 8 (eight) hours as needed for pain.    [provider]   triamcinolone lotion (KENALOG) 0.1 % Apply 1 application topically 2 (two) times daily as needed. 05/03/17   [provider]      VITAL SIGNS:  Blood pressure 117/70, pulse 89, temperature 98 F (36.7 C), temperature source Oral, resp. rate (!) 21, height 5\' 7"  (1.702 m), weight 120.2 kg (265 lb), SpO2 98 %.  PHYSICAL EXAMINATION:  GENERAL:  83 y.o.-year-old patient lying in the bed with no acute distress.  EYES: Pupils equal, round, reactive to light and accommodation. No scleral icterus. Extraocular muscles intact.  HEENT: Head atraumatic, normocephalic. Oropharynx and nasopharynx clear.  NECK:  Supple, no jugular venous distention. No thyroid enlargement, no tenderness.  LUNGS: Normal breath sounds bilaterally, no wheezing, rales,rhonchi or crepitation. No use of accessory muscles of respiration.  CARDIOVASCULAR: S1, S2 normal. No murmurs, rubs, or gallops.  ABDOMEN: Soft, nontender, nondistended. Bowel sounds present. No organomegaly or mass.  EXTREMITIES: Bilateral lower extremities are diffusely edematous, erythematous with chronic peripheral vascular changes. No cyanosis, or clubbing.  NEUROLOGIC: Awake and alert but disoriented Sensation intact. Gait not checked.  PSYCHIATRIC: The patient is alert and oriented x 1  SKIN: No obvious rash, lesion, or ulcer.   LABORATORY PANEL:   CBC Recent Labs  Lab 05/23/17 0926  WBC 11.1*  HGB 12.0*  HCT 36.5*  PLT 170   ------------------------------------------------------------------------------------------------------------------  Chemistries  Recent Labs  Lab 05/23/17 0926  NA 138  K 4.5  CL 106  CO2 21*  GLUCOSE 140*  BUN 30*  CREATININE 1.35*  CALCIUM 8.9   ------------------------------------------------------------------------------------------------------------------  Cardiac Enzymes Recent Labs  Lab 05/23/17 0926  TROPONINI 0.04*    ------------------------------------------------------------------------------------------------------------------  RADIOLOGY:  Ct Abdomen Pelvis Wo Contrast  Result Date: 05/23/2017 CLINICAL DATA:  Fall.  Low back pain. EXAM: CT ABDOMEN AND PELVIS WITHOUT CONTRAST TECHNIQUE: Multidetector CT imaging of the abdomen and pelvis was performed following the standard protocol without IV contrast. COMPARISON:  08/16/2013 FINDINGS: Lower chest: Mild cardiomegaly. No confluent opacities or effusions. Suspect COPD changes in lung bases. Hepatobiliary: Small layering stones in the gallbladder and possibly within the cystic duct. Gallbladder is otherwise unremarkable. No focal hepatic abnormality. Pancreas: No focal abnormality or ductal dilatation. Spleen: No focal abnormality.  Normal size. Adrenals/Urinary Tract: No adrenal abnormality. No focal renal abnormality. No stones or hydronephrosis. Urinary bladder is unremarkable. Stomach/Bowel: Sigmoid diverticulosis. No active diverticulitis. Stomach and small bowel decompressed, unremarkable. Vascular/Lymphatic: Aortic and iliac calcifications. No evidence of aneurysm or adenopathy. Reproductive: Prostate enlargement. Other: No free fluid or free air. Bilateral inguinal hernias containing fat. Musculoskeletal: Abnormal appearance of the right iliac bone with coarsened trabecula and cortical thickening, suggesting Paget's disease. This is unchanged. IMPRESSION: Cholelithiasis.  No CT evidence for acute cholecystitis. Sigmoid diverticulosis. Aortoiliac atherosclerosis. Bilateral inguinal hernias containing fat. Prostate enlargement. 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 Electronically Signed   By: Charlett Nose M.D.   On: 05/23/2017 10:04   Dg  Chest 2 View  Result Date: 05/23/2017 CLINICAL DATA:  Increasing weakness. EXAM: CHEST  2 VIEW COMPARISON:  08/17/2013 FINDINGS: Patient rotated to the left. AP and lateral views of the chest show hyperexpansion with chronic  interstitial coarsening. Cardiopericardial silhouette is at upper limits of normal for size. Subsegmental atelectasis or linear scarring noted both lung bases. Bones are diffusely demineralized. Telemetry leads overlie the chest. IMPRESSION: Bibasilar atelectasis or scarring. Electronically Signed   By: Kennith Center M.D.   On: 05/23/2017 11:08   Ct Head Wo Contrast  Result Date: 05/23/2017 CLINICAL DATA:  Fall. EXAM: CT HEAD WITHOUT CONTRAST TECHNIQUE: Contiguous axial images were obtained from the base of the skull through the vertex without intravenous contrast. COMPARISON:  10/23/2010. FINDINGS: Brain: There is no evidence for acute hemorrhage, hydrocephalus, mass lesion, or abnormal extra-axial fluid collection. No definite CT evidence for acute infarction. Diffuse loss of parenchymal volume is consistent with atrophy. Patchy low attenuation in the deep hemispheric and periventricular white matter is nonspecific, but likely reflects chronic microvascular ischemic demyelination. Vascular: No hyperdense vessel or unexpected calcification. Skull: No evidence for fracture. No worrisome lytic or sclerotic lesion. Sinuses/Orbits: The visualized paranasal sinuses and mastoid air cells are clear. Visualized portions of the globes and intraorbital fat are unremarkable. Other: None. IMPRESSION: 1. No acute intracranial abnormality. 2. Atrophy with chronic small vessel white matter ischemic disease. Electronically Signed   By: Kennith Center M.D.   On: 05/23/2017 10:07    EKG:   Orders placed or performed during the hospital encounter of 05/23/17  . ED EKG  . ED EKG  . EKG 12-Lead  . EKG 12-Lead  . EKG 12-Lead  . EKG 12-Lead    IMPRESSION AND PLAN:   Gregroy Dombkowski  is a 83 y.o. male with a known history of dementia, isn't requiring diabetes mellitus, peripheral vascular disease, essential hypertension and hyperlipidemia is brought into the ED after patient had 2 syncopal episodes today. Patient  chronically sleeps in the chair, and today morning wife found him on the floor at around 7:30 AM and doesn't know the down time. Patient could not recall being dizzy and falling onto the floor. EMS came but patient refused to come to the hospital. Patient has sustained another fall at around 9:00 and wife insisted him to come to the hospital. CT head is negative. Initial troponin at 0.04  # Syncope 2-Unclear etiology; probably cardiac syncope Admitted to MedSurg unit under observation status Telemetry Potassium is fine we'll check magnesium Gentle hydration with IV fluids Will get echocardiogram CT head is negative Cardiology consult to rule out Cardec syncope as EKG has revealed multiple PVCs  Check orthostatics  PT evaluation Cycle cardiac biomarkers  #Bilateral lower extremity cellulitis with chronic peripheral vascular disease IV clindamycin and vancomycin   bilateral lower extremity venous Dopplers to rule out DVT  #Insulin requiring diabetes mellitus Continue home medication Levemir and NovoLog sliding scale insulin Check urinalysis as patient is reporting frequent urination  #Hypernatremia continue Lipitor  #Essential hypertension continue verapamil  #Dementia continue Namenda   DVT prophylaxis with Lovenox subcutaneous   All the records are reviewed and case discussed with ED provider. Management plans discussed with the patient, WIFE  and they are in agreement.  CODE STATUS: fc, wife is HCPOA  TOTAL TIME TAKING CARE OF THIS PATIENT: 43  minutes.   Note: This dictation was prepared with Dragon dictation along with smaller phrase technology. Any transcriptional errors that result from this process are unintentional.  Ramonita LabGouru, Breeonna Mone M.D on 05/23/2017 at 12:20 PM  Between 7am to 6pm - Pager - [phone]  After 6pm go to [email or web address] - password [email or web address] [facility]  [facility]  Office  [phone]  CC: Primary care physician; Jaclyn Shaggyate, Denny C, MD

## 2017-05-23 NOTE — Progress Notes (Addendum)
Pts Troponin at 6:50pm was noted to be 0.05, BP at *;30pm was 86/45 and pulse of 55.  MD notified.  bolus of NS given to pt.  BP meds held this evening.  BP after bolus and BP cuff size change 118/55.  Pt asymptomatic.  Resting comfortably.

## 2017-05-23 NOTE — Progress Notes (Addendum)
MEDICATION RELATED CONSULT NOTE - INITIAL   Pharmacy Consult for electrolyte management Indication: syncope  Allergies  Allergen Reactions  . Penicillins Rash    Has patient had a PCN reaction causing immediate rash, facial/tongue/throat swelling, SOB or lightheadedness with hypotension: Yes Has patient had a PCN reaction causing severe rash involving mucus membranes or skin necrosis: No Has patient had a PCN reaction that required hospitalization: No Has patient had a PCN reaction occurring within the last 10 years: No If all of the above answers are "NO", then may proceed with Cephalosporin use.    Patient Measurements: Height: 5\' 7"  (170.2 cm) Weight: 265 lb (120.2 kg) IBW/kg (Calculated) : 66.1 Adjusted Body Weight:   Vital Signs: Temp: 98 F (36.7 C) (12/02 0857) Temp Source: Oral (12/02 0857) BP: 117/70 (12/02 1130) Pulse Rate: 89 (12/02 1100) Intake/Output from previous day: No intake/output data recorded. Intake/Output from this shift: No intake/output data recorded.  Labs: Recent Labs    05/23/17 0926  WBC 11.1*  HGB 12.0*  HCT 36.5*  PLT 170  CREATININE 1.35*   Estimated Creatinine Clearance: 52.3 mL/min (A) (by C-G formula based on SCr of 1.35 mg/dL (H)).   Microbiology: No results found for this or any previous visit (from the past 720 hour(s)).  Medical History: Past Medical History:  Diagnosis Date  . Dementia   . Diabetes mellitus without complication (HCC)   . History of MI (myocardial infarction)   . Hyperlipidemia   . Hypertension   . PVD (peripheral vascular disease) (HCC)     Medications:  Infusions:   Assessment: 84 yom cc weakness. Family found him on the floor, difficult to arouse. PMH dementia, DM, hx MI, HLD, HTN, PVD. Pharmacy consulted to manage electrolytes.   Goal of Therapy:  Electrolytes WNL  Plan:  K 4.5 this morning. Will check add-on magnesium/phosphorus and replete per protocol.  14:51 phos returned 2.4 Give K  Phos Neutral tab 500 mg po Q4H x 2 doses. Magnesium level still pending - spoke with lab - they will run as add-on. Recheck all electrolytes tomorrow with AM labs.  15:26 magnesium returned 1.4. Will give 4 gm IV x 1 per protocol and recheck all electrolytes tomorrow with AM labs.   Carola FrostNathan A Muneer Leider, Pharm.D., BCPS Clinical Pharmacist 05/23/2017,12:01 PM

## 2017-05-23 NOTE — ED Triage Notes (Signed)
Patient from home via [facility] with reports of increased weakness and falls since yesterday. Per EMS, they were called out during the night to assist patient back into chair after he fell. EMS called again this morning after patient slid down side of bed and unable to get up. Patient complaining of back pain since fall. Patient also has new bilateral leg swelling and redness, noticed this morning by wife and EMS. Patient with history of alzheimer's and oriented at baseline.

## 2017-05-23 NOTE — ED Provider Notes (Signed)
[facility] Emergency Department Provider Note  ____________________________________________   First MD Initiated Contact with Patient 05/23/17 [phone]     (approximate)  I have reviewed the triage vital signs and the nursing notes.   HISTORY  Chief Complaint Weakness  EM caveat: Related dementia, unable to provide full history HPI Wolfgang Phoenixhomas F Gombert is a 83 y.o. male here for evaluation of episode where he passed out  Patient family relates that they found him on the floor this morning, not certain exactly how long he been there but he was difficult to arouse.  The patient reports that he passed out.  He does not recall much, and has a history of dementia  Denies pain except for pain in his lower back.  Family reports he is complaining of upper back pain after a fall yesterday.  Denies any numbness or weakness just reports "I am an old geezer."  Son who helps care for him reports patient has been weak recently, had increased falls, and today I believe he passed out and was somewhat hard to arouse for several minutes on the floor.  They are not quite sure how long he was down but it probably is some.  During this morning.  Patient denies any chest pain headache nausea or vomiting.  No fevers or chills.  Does report pain in his lower back with movement.   Past Medical History:  Diagnosis Date  . Dementia   . Diabetes mellitus without complication (HCC)   . History of MI (myocardial infarction)   . Hyperlipidemia   . Hypertension   . PVD (peripheral vascular disease) (HCC)     There are no active problems to display for this patient.   Past Surgical History:  Procedure Laterality Date  . CORONARY ANGIOPLASTY    . HERNIA REPAIR      Prior to Admission medications   Medication Sig Start Date End Date Taking? Authorizing Provider  acetaminophen (TYLENOL) 650 MG CR tablet Take 650 mg by mouth every 8 (eight) hours as needed for pain.    [provider]  aspirin 325 MG tablet Take 325 mg by mouth daily.    [provider]  atorvastatin (LIPITOR) 40 MG tablet Take 40 mg by mouth daily.    [provider]  donepezil (ARICEPT) 10 MG tablet Take 10 mg by mouth at bedtime as needed.    [provider]  insulin aspart (NOVOLOG FLEXPEN) 100 UNIT/ML injection Inject 30 Units into the skin 3 (three) times daily before meals.    [provider]  insulin aspart (NOVOLOG) 100 UNIT/ML injection Inject 40 Units into the skin 3 (three) times daily before meals.    [provider]  insulin detemir (LEVEMIR) 100 UNIT/ML injection Inject 50 Units into the skin at bedtime.    [provider]  memantine (NAMENDA) 10 MG tablet Take 1 tablet by mouth 2 (two) times daily. 04/26/17   [provider]  memantine (NAMENDA) 5 MG tablet Take 5 mg by mouth 2 (two) times daily.    [provider]  metFORMIN (GLUCOPHAGE) 500 MG tablet Take 500 mg by mouth 2 (two) times daily with a meal.    [provider]  pantoprazole (PROTONIX) 40 MG tablet Take 1 tablet by mouth daily. 05/11/17   [provider]  quinapril (ACCUPRIL) 20 MG tablet Take 20 mg by mouth at bedtime.    [provider]  torsemide (DEMADEX) 20 MG tablet Take 20 mg  by mouth daily.    [provider]  traMADol (ULTRAM) 50 MG tablet Take 50 mg by mouth every 6 (six) hours as needed for pain.    [provider]  triamcinolone lotion (KENALOG) 0.1 % Apply 1 application topically 2 (two) times daily as needed. 05/03/17   [provider]  VERAPAMIL HCL ER PO Take 200 mg by mouth daily.    [provider]    Allergies Penicillins  No family history on file.  Social History Social History   Tobacco Use  . Smoking status: Former Smoker  Substance Use Topics  . Alcohol use: No  . Drug use: Not on file    Review of Systems EM  caveat  ____________________________________________   PHYSICAL EXAM:  VITAL SIGNS: ED Triage Vitals  Enc Vitals Group     BP 05/23/17 0857 119/71     Pulse Rate 05/23/17 0857 93     Resp 05/23/17 0857 19     Temp 05/23/17 0857 98 F (36.7 C)     Temp Source 05/23/17 0857 Oral     SpO2 05/23/17 0857 99 %     Weight 05/23/17 0858 265 lb (120.2 kg)     Height 05/23/17 0858 5\' 7"  (1.702 m)     Head Circumference --      Peak Flow --      Pain Score 05/23/17 0857 10     Pain Loc --      Pain Edu? --      Excl. in GC? --     Constitutional: Alert and oriented. Well appearing and in no acute distress. Eyes: Conjunctivae are normal. Head: Atraumatic. Nose: No congestion/rhinnorhea. Mouth/Throat: Mucous membranes are moist. Neck: No stridor.   Cardiovascular: Normal rate, regular rhythm. Grossly normal heart sounds.  Good peripheral circulation. Respiratory: Normal respiratory effort.  No retractions. Lungs CTAB. Gastrointestinal: Soft and nontender. No distention. Musculoskeletal: No lower extremity tenderness though there is 3+ pitting edema lower extremities bilaterally Neurologic:  Normal speech and language. No gross focal neurologic deficits are appreciated.  Skin:  Skin is warm, dry and intact. No rash noted. Psychiatric: Mood and affect are normal. Speech and behavior are normal.  ____________________________________________   LABS (all labs ordered are listed, but only abnormal results are displayed)  Labs Reviewed  BASIC METABOLIC PANEL - Abnormal; Notable for the following components:      Result Value   CO2 21 (*)    Glucose, Bld 140 (*)    BUN 30 (*)    Creatinine, Ser 1.35 (*)    GFR calc non Af Amer 47 (*)    GFR calc Af Amer 55 (*)    All other components within normal limits  CBC - Abnormal; Notable for the following components:   WBC 11.1 (*)    RBC 4.00 (*)    Hemoglobin 12.0 (*)    HCT 36.5 (*)    RDW 15.4 (*)    All other components within  normal limits  URINALYSIS, COMPLETE (UACMP) WITH MICROSCOPIC - Abnormal; Notable for the following components:   Color, Urine YELLOW (*)    APPearance CLEAR (*)    Hgb urine dipstick SMALL (*)    Squamous Epithelial / LPF 0-5 (*)    All other components within normal limits  TROPONIN I - Abnormal; Notable for the following components:   Troponin I 0.04 (*)    All other components within normal limits  BRAIN NATRIURETIC PEPTIDE - Abnormal; Notable for the  following components:   B Natriuretic Peptide 176.0 (*)    All other components within normal limits  CK  CBG MONITORING, ED   ____________________________________________  EKG  Reviewed and interpreted by me at 9 AM Heart rate 100 Q is 130 QTC 460 Normal sinus rhythm with multiple frequent PVCs.  No evidence of ischemic changes as noted, but again multiple PVCs and some artifact is present ____________________________________________  RADIOLOGY  Ct Abdomen Pelvis Wo Contrast  Result Date: 05/23/2017 CLINICAL DATA:  Fall.  Low back pain. EXAM: CT ABDOMEN AND PELVIS WITHOUT CONTRAST TECHNIQUE: Multidetector CT imaging of the abdomen and pelvis was performed following the standard protocol without IV contrast. COMPARISON:  08/16/2013 FINDINGS: Lower chest: Mild cardiomegaly. No confluent opacities or effusions. Suspect COPD changes in lung bases. Hepatobiliary: Small layering stones in the gallbladder and possibly within the cystic duct. Gallbladder is otherwise unremarkable. No focal hepatic abnormality. Pancreas: No focal abnormality or ductal dilatation. Spleen: No focal abnormality.  Normal size. Adrenals/Urinary Tract: No adrenal abnormality. No focal renal abnormality. No stones or hydronephrosis. Urinary bladder is unremarkable. Stomach/Bowel: Sigmoid diverticulosis. No active diverticulitis. Stomach and small bowel decompressed, unremarkable. Vascular/Lymphatic: Aortic and iliac calcifications. No evidence of aneurysm or  adenopathy. Reproductive: Prostate enlargement. Other: No free fluid or free air. Bilateral inguinal hernias containing fat. Musculoskeletal: Abnormal appearance of the right iliac bone with coarsened trabecula and cortical thickening, suggesting Paget's disease. This is unchanged. IMPRESSION: Cholelithiasis.  No CT evidence for acute cholecystitis. Sigmoid diverticulosis. Aortoiliac atherosclerosis. Bilateral inguinal hernias containing fat. Prostate enlargement. 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 Electronically Signed   By: Charlett NoseKevin  Dover M.D.   On: 05/23/2017 10:04   Dg Chest 2 View  Result Date: 05/23/2017 CLINICAL DATA:  Increasing weakness. EXAM: CHEST  2 VIEW COMPARISON:  08/17/2013 FINDINGS: Patient rotated to the left. AP and lateral views of the chest show hyperexpansion with chronic interstitial coarsening. Cardiopericardial silhouette is at upper limits of normal for size. Subsegmental atelectasis or linear scarring noted both lung bases. Bones are diffusely demineralized. Telemetry leads overlie the chest. IMPRESSION: Bibasilar atelectasis or scarring. Electronically Signed   By: Kennith CenterEric  Mansell M.D.   On: 05/23/2017 11:08   Ct Head Wo Contrast  Result Date: 05/23/2017 CLINICAL DATA:  Fall. EXAM: CT HEAD WITHOUT CONTRAST TECHNIQUE: Contiguous axial images were obtained from the base of the skull through the vertex without intravenous contrast. COMPARISON:  10/23/2010. FINDINGS: Brain: There is no evidence for acute hemorrhage, hydrocephalus, mass lesion, or abnormal extra-axial fluid collection. No definite CT evidence for acute infarction. Diffuse loss of parenchymal volume is consistent with atrophy. Patchy low attenuation in the deep hemispheric and periventricular white matter is nonspecific, but likely reflects chronic microvascular ischemic demyelination. Vascular: No hyperdense vessel or unexpected calcification. Skull: No evidence for fracture. No worrisome lytic or sclerotic lesion.  Sinuses/Orbits: The visualized paranasal sinuses and mastoid air cells are clear. Visualized portions of the globes and intraorbital fat are unremarkable. Other: None. IMPRESSION: 1. No acute intracranial abnormality. 2. Atrophy with chronic small vessel white matter ischemic disease. Electronically Signed   By: Kennith CenterEric  Mansell M.D.   On: 05/23/2017 10:07    Chest x-ray reveals no acute abnormality, CT of the head does not show acute, abdominal CT reviewed by me, no acute findings cholelithiasis is noted also Paget's disease ____________________________________________   PROCEDURES  Procedure(s) performed: None  Procedures  Critical Care performed: No  ____________________________________________   INITIAL IMPRESSION / ASSESSMENT AND PLAN / ED COURSE  Pertinent labs & imaging results that were available during my care of the patient were reviewed by me and considered in my medical decision making (see chart for details).  Patient was for evaluation of syncopal episode.  Currently no acute complaint except for lower back pain and does have tenderness to palpation to lower lumbar spine without deformity.  He did have a syncopal episode, reportedly down with collapse for unknown amount of time possibly a few minutes to several hours.  Denies any ongoing neurologic cardiac or pulmonary symptoms.  We will initiate a broad workup, including CT scan to evaluate for etiology of back pain or possible injury.  I am concerned about the patient's syncope given his age, also multiple PVCs on his EKG, unknown if these are chronic or not.  Does have mild renal insufficiency which is wife reports is been some event workup and outpatient.    ----------------------------------------- 11:35 AM on 05/23/2017 -----------------------------------------  Patient resting comfortably at this time.  Based on his syncope, multiple PVCs, and minimally elevated troponin discussed with patient and family, will admit  him for further observation and workup under the hospitalist system regarding his syncope, edema, collapse etc. Today.  He is stable for admission.  ____________________________________________   FINAL CLINICAL IMPRESSION(S) / ED DIAGNOSES  Final diagnoses:  Back pain  Syncope and collapse  Frequent unifocal PVCs  Elevated troponin I level  Fall, initial encounter  Lumbar contusion, initial encounter      NEW MEDICATIONS STARTED DURING THIS VISIT:  This SmartLink is deprecated. Use AVSMEDLIST instead to display the medication list for a patient.   Note:  This document was prepared using Dragon voice recognition software and may include unintentional dictation errors.     Sharyn Creamer, MD 05/23/17 1137

## 2017-05-23 NOTE — Plan of Care (Signed)
Admitted from ED with syncopal episode. Wife cares for pt at home and uses "Doctor making house calls" due to inability to transport pt for appts. She reports pt has refused her to assist with hygiene and personal care services in the past and was unaware of skin issues until today. Having repeated falls at home recently. Sleep in recliner for breathing and uses a walker to go to the BR. Admission done. Clindamycin started and will give magnesium asap. Tylenol given for pain in bilateral feet with burning with pt refusing to wear yellow socks with wife removing them. Extensive skin care performed. Meds updated.  Wife reports recent insulin dose change with MD notified with orders modified. Resting quietly after tylenol given for bilateral foot/leg pain "burning".

## 2017-05-24 ENCOUNTER — Observation Stay (HOSPITAL_BASED_OUTPATIENT_CLINIC_OR_DEPARTMENT_OTHER)
Admit: 2017-05-24 | Discharge: 2017-05-24 | Disposition: A | Discharge status: Home / Self Care | Payer: Medicare Other | Attending: Internal Medicine | Admitting: Internal Medicine

## 2017-05-24 ENCOUNTER — Observation Stay: Payer: Medicare Other

## 2017-05-24 DIAGNOSIS — R0600 Dyspnea, unspecified: Secondary | ICD-10-CM

## 2017-05-24 DIAGNOSIS — R55 Syncope and collapse: Secondary | ICD-10-CM | POA: Diagnosis not present

## 2017-05-24 LAB — BASIC METABOLIC PANEL
Anion gap: 11 (ref 5–15)
BUN: 24 mg/dL — AB (ref 6–20)
CHLORIDE: 109 mmol/L (ref 101–111)
CO2: 21 mmol/L — AB (ref 22–32)
CREATININE: 1.29 mg/dL — AB (ref 0.61–1.24)
Calcium: 8.2 mg/dL — ABNORMAL LOW (ref 8.9–10.3)
GFR calc Af Amer: 58 mL/min — ABNORMAL LOW (ref >60)
GFR calc non Af Amer: 50 mL/min — ABNORMAL LOW (ref >60)
GLUCOSE: 97 mg/dL (ref 65–99)
Potassium: 3.6 mmol/L (ref 3.5–5.1)
SODIUM: 141 mmol/L (ref 135–145)

## 2017-05-24 LAB — GLUCOSE, CAPILLARY
GLUCOSE-CAPILLARY: 84 mg/dL (ref 65–99)
GLUCOSE-CAPILLARY: 99 mg/dL (ref 65–99)
GLUCOSE-CAPILLARY: 109 mg/dL — AB (ref 65–99)
GLUCOSE-CAPILLARY: 183 mg/dL — AB (ref 65–99)

## 2017-05-24 LAB — PHOSPHORUS: Phosphorus: 3.9 mg/dL (ref 2.5–4.6)

## 2017-05-24 LAB — APTT: APTT: 33 s (ref 24–36)

## 2017-05-24 LAB — CK: CK TOTAL: 343 U/L (ref 49–397)

## 2017-05-24 LAB — PROTIME-INR
INR: 1.14
Prothrombin Time: 14.5 seconds (ref 11.4–15.2)

## 2017-05-24 LAB — MAGNESIUM: MAGNESIUM: 2.2 mg/dL (ref 1.7–2.4)

## 2017-05-24 LAB — TROPONIN I
TROPONIN I: 0.32 ng/mL — AB (ref <0.03)
Troponin I: 0.24 ng/mL (ref <0.03)

## 2017-05-24 MED ORDER — HEPARIN (PORCINE) IN NACL 100-0.45 UNIT/ML-% IJ SOLN
1200.0000 [IU]/h | INTRAMUSCULAR | Status: DC
  Administered 2017-05-24: 1200 [IU]/h via INTRAVENOUS

## 2017-05-24 MED ORDER — ENOXAPARIN SODIUM 40 MG/0.4ML ~~LOC~~ SOLN
40.0000 mg | SUBCUTANEOUS | Status: DC
  Administered 2017-05-24: 40 mg via SUBCUTANEOUS
  Filled 2017-05-24: qty 0.4

## 2017-05-24 MED ORDER — CARVEDILOL 3.125 MG PO TABS
3.1250 mg | ORAL_TABLET | Freq: Two times a day (BID) | ORAL | Status: DC
  Administered 2017-05-24 – 2017-05-25 (×2): 3.125 mg via ORAL
  Filled 2017-05-24 (×2): qty 1

## 2017-05-24 MED ORDER — HEPARIN (PORCINE) IN NACL 100-0.45 UNIT/ML-% IJ SOLN
INTRAMUSCULAR | Status: AC
  Administered 2017-05-24: 4000 [IU] via INTRAVENOUS
  Filled 2017-05-24: qty 250

## 2017-05-24 MED ORDER — HEPARIN BOLUS VIA INFUSION
4000.0000 [IU] | Freq: Once | INTRAVENOUS | Status: AC
  Administered 2017-05-24: 4000 [IU] via INTRAVENOUS
  Filled 2017-05-24: qty 4000

## 2017-05-24 MED ORDER — ACETAMINOPHEN 325 MG PO TABS: 650.0000 mg | ORAL_TABLET | Freq: Three times a day (TID) | ORAL | Status: DC | PRN

## 2017-05-24 NOTE — Progress Notes (Signed)
MEDICATION RELATED CONSULT NOTE - INITIAL   Pharmacy Consult for electrolyte management Indication: syncope  Allergies  Allergen Reactions  . Penicillins Rash    Has patient had a PCN reaction causing immediate rash, facial/tongue/throat swelling, SOB or lightheadedness with hypotension: Yes Has patient had a PCN reaction causing severe rash involving mucus membranes or skin necrosis: No Has patient had a PCN reaction that required hospitalization: No Has patient had a PCN reaction occurring within the last 10 years: No If all of the above answers are "NO", then may proceed with Cephalosporin use.    Patient Measurements: Height: 5\' 7"  (170.2 cm) Weight: 202 lb 8 oz (91.9 kg) IBW/kg (Calculated) : 66.1 Adjusted Body Weight:   Vital Signs: Temp: 97.6 F (36.4 C) (12/03 0852) Temp Source: Oral (12/03 0401) BP: 103/59 (12/03 0852) Pulse Rate: 88 (12/03 0852) Intake/Output from previous day: 12/02 0701 - 12/03 0700 In: 505.2 [P.O.:240; I.V.:15.2; IV Piggyback:250] Out: 702 [Urine:701; Stool:1] Intake/Output from this shift: Total I/O In: 360 [P.O.:360] Out: 150 [Urine:150]  Labs: Recent Labs    05/23/17 0926 05/24/17 0132 05/24/17 0416  WBC 11.1*  --   --   HGB 12.0*  --   --   HCT 36.5*  --   --   PLT 170  --   --   APTT  --   --  33  CREATININE 1.35* 1.29*  --   MG 1.4* 2.2  --   PHOS 2.4* 3.9  --     Lab Results  Component Value Date   K 3.6 05/24/2017   Estimated Creatinine Clearance: 47.7 mL/min (A) (by C-G formula based on SCr of 1.29 mg/dL (H)).   Microbiology: No results found for this or any previous visit (from the past 720 hour(s)).  Medical History: Past Medical History:  Diagnosis Date  . Dementia   . Diabetes mellitus without complication (HCC)   . History of MI (myocardial infarction)   . Hyperlipidemia   . Hypertension   . PVD (peripheral vascular disease) (HCC)     Medications:  Infusions:   Assessment: 84 yom cc weakness.  Family found him on the floor, difficult to arouse. PMH dementia, DM, hx MI, HLD, HTN, PVD. Pharmacy consulted to manage electrolytes.   Goal of Therapy:  Electrolytes WNL  Plan:  K 4.5 this morning. Will check add-on magnesium/phosphorus and replete per protocol.  14:51 phos returned 2.4 Give K Phos Neutral tab 500 mg po Q4H x 2 doses. Magnesium level still pending - spoke with lab - they will run as add-on. Recheck all electrolytes tomorrow with AM labs.  15:26 magnesium returned 1.4. Will give 4 gm IV x 1 per protocol and recheck all electrolytes tomorrow with AM labs.   12/3  K 3.6, Mag 2.2  Phos 3.9  Electrolytes WNL. F/u am labs.  Shany Marinez A, Pharm.D., BCPS Clinical Pharmacist 05/24/2017,10:30 AM

## 2017-05-24 NOTE — Plan of Care (Signed)
  Clinical Measurements: Ability to maintain clinical measurements within normal limits will improve 05/24/2017 1535 - Not Progressing by Raynald BlendImhoff, Rohin Krejci M, RN Note BUN = elevated at 24 today. Will continue to monitor renal function. Jari FavreSteven M Richland Hsptlmhoff

## 2017-05-24 NOTE — Progress Notes (Signed)
Pt arrived from 1A alert and oriented. No c/o pain, no SOB. Skin and telemetry verified with [place]. Heparin gtt started, no concerns offered from pt.

## 2017-05-24 NOTE — Progress Notes (Signed)
[facility] - [place] [facility]   PATIENT NAME: Shaun Davis    MR#:  [id number]  DATE OF BIRTH:  1933-01-04  SUBJECTIVE:  CHIEF COMPLAINT:   Chief Complaint  Patient presents with  . Weakness   Patient has dementia.  He has no concerns.  Wife at bedside provides history.  Ambulates with a walker at home.  REVIEW OF SYSTEMS:    Review of Systems  Unable to perform ROS: Dementia    DRUG ALLERGIES:   Allergies  Allergen Reactions  . Penicillins Rash    Has patient had a PCN reaction causing immediate rash, facial/tongue/throat swelling, SOB or lightheadedness with hypotension: Yes Has patient had a PCN reaction causing severe rash involving mucus membranes or skin necrosis: No Has patient had a PCN reaction that required hospitalization: No Has patient had a PCN reaction occurring within the last 10 years: No If all of the above answers are "NO", then may proceed with Cephalosporin use.    VITALS:  Blood pressure (!) 103/59, pulse 88, temperature 97.6 F (36.4 C), resp. rate 18, height 5\' 7"  (1.702 m), weight 91.9 kg (202 lb 8 oz), SpO2 95 %.  PHYSICAL EXAMINATION:   Physical Exam  GENERAL:  83 y.o.-year-old patient lying in the bed with no acute distress.  EYES: Pupils equal, round, reactive to light and accommodation. No scleral icterus. Extraocular muscles intact.  HEENT: Head atraumatic, normocephalic. Oropharynx and nasopharynx clear.  NECK:  Supple, no jugular venous distention. No thyroid enlargement, no tenderness.  LUNGS: Normal breath sounds bilaterally, no wheezing, rales, rhonchi. No use of accessory muscles of respiration.  CARDIOVASCULAR: S1, S2 normal. No murmurs, rubs, or gallops.  ABDOMEN: Soft, nontender, nondistended. Bowel sounds present. No organomegaly or mass.  EXTREMITIES: No cyanosis, clubbing or edema b/l.    NEUROLOGIC: Cranial nerves II through XII are intact. No focal Motor or sensory deficits b/l.   PSYCHIATRIC: The  patient is alert and awake. Not oriented SKIN: No obvious rash, lesion, or ulcer.   LABORATORY PANEL:   CBC Recent Labs  Lab 05/23/17 0926  WBC 11.1*  HGB 12.0*  HCT 36.5*  PLT 170   ------------------------------------------------------------------------------------------------------------------ Chemistries  Recent Labs  Lab 05/24/17 0132  NA 141  K 3.6  CL 109  CO2 21*  GLUCOSE 97  BUN 24*  CREATININE 1.29*  CALCIUM 8.2*  MG 2.2   ------------------------------------------------------------------------------------------------------------------  Cardiac Enzymes Recent Labs  Lab 05/24/17 0829  TROPONINI 0.32*   ------------------------------------------------------------------------------------------------------------------  RADIOLOGY:  Ct Abdomen Pelvis Wo Contrast  Result Date: 05/23/2017 CLINICAL DATA:  Fall.  Low back pain. EXAM: CT ABDOMEN AND PELVIS WITHOUT CONTRAST TECHNIQUE: Multidetector CT imaging of the abdomen and pelvis was performed following the standard protocol without IV contrast. COMPARISON:  08/16/2013 FINDINGS: Lower chest: Mild cardiomegaly. No confluent opacities or effusions. Suspect COPD changes in lung bases. Hepatobiliary: Small layering stones in the gallbladder and possibly within the cystic duct. Gallbladder is otherwise unremarkable. No focal hepatic abnormality. Pancreas: No focal abnormality or ductal dilatation. Spleen: No focal abnormality.  Normal size. Adrenals/Urinary Tract: No adrenal abnormality. No focal renal abnormality. No stones or hydronephrosis. Urinary bladder is unremarkable. Stomach/Bowel: Sigmoid diverticulosis. No active diverticulitis. Stomach and small bowel decompressed, unremarkable. Vascular/Lymphatic: Aortic and iliac calcifications. No evidence of aneurysm or adenopathy. Reproductive: Prostate enlargement. Other: No free fluid or free air. Bilateral inguinal hernias containing fat. Musculoskeletal: Abnormal  appearance of the right iliac bone with coarsened trabecula and cortical thickening, suggesting Paget's disease. This  is unchanged. IMPRESSION: Cholelithiasis.  No CT evidence for acute cholecystitis. Sigmoid diverticulosis. Aortoiliac atherosclerosis. Bilateral inguinal hernias containing fat. Prostate enlargement. 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 Electronically Signed   By: Charlett Nose M.D.   On: 05/23/2017 10:04   Dg Chest 2 View  Result Date: 05/23/2017 CLINICAL DATA:  Increasing weakness. EXAM: CHEST  2 VIEW COMPARISON:  08/17/2013 FINDINGS: Patient rotated to the left. AP and lateral views of the chest show hyperexpansion with chronic interstitial coarsening. Cardiopericardial silhouette is at upper limits of normal for size. Subsegmental atelectasis or linear scarring noted both lung bases. Bones are diffusely demineralized. Telemetry leads overlie the chest. IMPRESSION: Bibasilar atelectasis or scarring. Electronically Signed   By: Kennith Center M.D.   On: 05/23/2017 11:08   Ct Head Wo Contrast  Result Date: 05/23/2017 CLINICAL DATA:  Fall. EXAM: CT HEAD WITHOUT CONTRAST TECHNIQUE: Contiguous axial images were obtained from the base of the skull through the vertex without intravenous contrast. COMPARISON:  10/23/2010. FINDINGS: Brain: There is no evidence for acute hemorrhage, hydrocephalus, mass lesion, or abnormal extra-axial fluid collection. No definite CT evidence for acute infarction. Diffuse loss of parenchymal volume is consistent with atrophy. Patchy low attenuation in the deep hemispheric and periventricular white matter is nonspecific, but likely reflects chronic microvascular ischemic demyelination. Vascular: No hyperdense vessel or unexpected calcification. Skull: No evidence for fracture. No worrisome lytic or sclerotic lesion. Sinuses/Orbits: The visualized paranasal sinuses and mastoid air cells are clear. Visualized portions of the globes and intraorbital fat are  unremarkable. Other: None. IMPRESSION: 1. No acute intracranial abnormality. 2. Atrophy with chronic small vessel white matter ischemic disease. Electronically Signed   By: Kennith Center M.D.   On: 05/23/2017 10:07   US Venous Img Lower Bilateral  Result Date: 05/24/2017 CLINICAL DATA:  83 year old male with a history of swelling EXAM: BILATERAL LOWER EXTREMITY VENOUS DOPPLER ULTRASOUND TECHNIQUE: Gray-scale sonography with graded compression, as well as color Doppler and duplex ultrasound were performed to evaluate the lower extremity deep venous systems from the level of the common femoral vein and including the common femoral, femoral, profunda femoral, popliteal and calf veins including the posterior tibial, peroneal and gastrocnemius veins when visible. The superficial great saphenous vein was also interrogated. Spectral Doppler was utilized to evaluate flow at rest and with distal augmentation maneuvers in the common femoral, femoral and popliteal veins. COMPARISON:  None. FINDINGS: RIGHT LOWER EXTREMITY Common Femoral Vein: No evidence of thrombus. Normal compressibility, respiratory phasicity and response to augmentation. Saphenofemoral Junction: No evidence of thrombus. Normal compressibility and flow on color Doppler imaging. Profunda Femoral Vein: No evidence of thrombus. Normal compressibility and flow on color Doppler imaging. Femoral Vein: No evidence of thrombus. Normal compressibility, respiratory phasicity and response to augmentation. Popliteal Vein: No evidence of thrombus. Normal compressibility, respiratory phasicity and response to augmentation. Calf Veins: No evidence of thrombus. Normal compressibility and flow on color Doppler imaging. Superficial Great Saphenous Vein: No evidence of thrombus. Normal compressibility and flow on color Doppler imaging. Other Findings:  None. LEFT LOWER EXTREMITY Common Femoral Vein: No evidence of thrombus. Normal compressibility, respiratory phasicity and  response to augmentation. Saphenofemoral Junction: No evidence of thrombus. Normal compressibility and flow on color Doppler imaging. Profunda Femoral Vein: No evidence of thrombus. Normal compressibility and flow on color Doppler imaging. Femoral Vein: No evidence of thrombus. Normal compressibility, respiratory phasicity and response to augmentation. Popliteal Vein: No evidence of thrombus. Normal compressibility, respiratory phasicity and response to augmentation. Calf Veins: No  evidence of thrombus. Normal compressibility and flow on color Doppler imaging. Superficial Great Saphenous Vein: No evidence of thrombus. Normal compressibility and flow on color Doppler imaging. Other Findings:  None. IMPRESSION: Sonographic survey bilateral lower extremities negative for DVT Electronically Signed   By: Gilmer MorJaime  Wagner D.O.   On: 05/24/2017 10:29     ASSESSMENT AND PLAN:   * Recurrent falls versus syncope.  Patient is poor historian.  Unclear history.  Has bigeminy on telemetry.  Verapamil changed to Coreg.  Appreciate cardiology input. Echocardiogram done and report pending. Mild elevation in troponin likely due to falls and elevated CK. Stop heparin drip.  *Hypertension.  Patient has been hypotensive here.  We will stop ACE inhibitor.  Verapamil changed to Coreg. Could be because of syncope.  *Dementia.  Watch for inpatient delirium.  All the records are reviewed and case discussed with Care Management/Social Worker Management plans discussed with the patient, family and they are in agreement.  CODE STATUS: FULL CODE  DVT Prophylaxis: SCDs  TOTAL TIME TAKING CARE OF THIS PATIENT: 30 minutes.   POSSIBLE D/C IN 1-2 DAYS, DEPENDING ON CLINICAL CONDITION.  Molinda BailiffSrikar R Belkys Henault M.D on 05/24/2017 at 3:37 PM  Between 7am to 6pm - Pager - [phone]  After 6pm go to [email or web address] - password [email or web address] [facility]  [facility] [place] [facility]  Office  [phone]  CC: Primary care physician; Jaclyn Shaggyate, Denny C,  MD  Note: This dictation was prepared with Dragon dictation along with smaller phrase technology. Any transcriptional errors that result from this process are unintentional.

## 2017-05-24 NOTE — Progress Notes (Signed)
Repeat troponin came back at 0.24.  Pt continues to have multiple PVCs .  MD notified.  Ordered a transfer to cardiac floor with heparin gtt to be started.

## 2017-05-24 NOTE — Progress Notes (Signed)
ANTICOAGULATION CONSULT NOTE - Initial Consult  Pharmacy Consult for heparin drip Indication: chest pain/ACS/elevated troponin  Allergies  Allergen Reactions  . Penicillins Rash    Has patient had a PCN reaction causing immediate rash, facial/tongue/throat swelling, SOB or lightheadedness with hypotension: Yes Has patient had a PCN reaction causing severe rash involving mucus membranes or skin necrosis: No Has patient had a PCN reaction that required hospitalization: No Has patient had a PCN reaction occurring within the last 10 years: No If all of the above answers are "NO", then may proceed with Cephalosporin use.    Patient Measurements: Height: 5\' 9"  (175.3 cm) Weight: 203 lb 2 oz (92.1 kg) IBW/kg (Calculated) : 70.7 Heparin Dosing Weight: 89kg  Vital Signs: Temp: 98 F (36.7 C) (12/02 2031) Temp Source: Oral (12/02 2031) BP: 118/55 (12/02 2308) Pulse Rate: 81 (12/02 2308)  Labs: Recent Labs    05/23/17 0926 05/23/17 1850 05/24/17 0132  HGB 12.0*  --   --   HCT 36.5*  --   --   PLT 170  --   --   CREATININE 1.35*  --  1.29*  CKTOTAL 298  --   --   TROPONINI 0.03*  0.04* 0.05* 0.24*    Estimated Creatinine Clearance: 49.5 mL/min (A) (by C-G formula based on SCr of 1.29 mg/dL (H)).   Medical History: Past Medical History:  Diagnosis Date  . Dementia   . Diabetes mellitus without complication (HCC)   . History of MI (myocardial infarction)   . Hyperlipidemia   . Hypertension   . PVD (peripheral vascular disease) (HCC)     Medications:  No anticoag in PTA meds  Assessment:  Goal of Therapy:  Heparin level 0.3-0.7 units/ml Monitor platelets by anticoagulation protocol: Yes   Plan:  4000 unit bolus and initial rate of 1200 units/hr. First heparin level 8 hours after start of infusion.  Gemini Bunte S 05/24/2017,3:40 AM

## 2017-05-24 NOTE — Care Management Obs Status (Signed)
MEDICARE OBSERVATION STATUS NOTIFICATION   Patient Details  Name: Shaun Davis MRN: [id number] Date of Birth: 12/07/1932   Medicare Observation Status Notification Given:  Yes Notice signed, one given to patient and the other to HIM for scanning   Eber HongGreene, Zanyiah Posten R, RN 05/24/2017, 11:39 AM

## 2017-05-24 NOTE — Consult Note (Signed)
[facility]  CARDIOLOGY CONSULT NOTE  Patient ID: Shaun Davis MRN: [id number] DOB/AGE: Aug 17, 1932 83 y.o.  Admit date: 05/23/2017 Referring Physician Dr. Elpidio AnisSudini Primary Physician Drs. Making housecalls Primary Cardiologist   Reason for Consultation syncope  HPI: Patient is an 83 year old male with history of dementia, diabetes, history of myocardial infarction in 1986, hyperlipidemia and hypertension who was admitted after 2 unwitnessed falls.  Patient is a difficult historian.  He is not aware of whether or not he had syncope.  Patient was found on the floor by his wife.  He is unaware of how long he was there and what caused him to get there.  EKG in the emergency room revealed sinus rhythm with intermittent ventricular bigeminy.  He was relatively hypotensive.  He had been treated with ACE inhibitor and long-acting verapamil.  He ACE inhibitor was discontinued.  Telemetry has revealed sinus rhythm with frequent PVCs.  There were no ventricular runs.  There are no pauses.  Patient denies any chest pain.  Serum troponin increased to 0.32.  Serum magnesium was 1.4.  Potassium was normal.  Echocardiogram is pending.  Remain relatively hypotensive.  The patient has newly edematous lower extremities with a rash bilaterally.  Review of Systems  HENT: Negative.   Eyes: Negative.   Respiratory: Negative.   Cardiovascular: Positive for palpitations and leg swelling.  Gastrointestinal: Negative.   Genitourinary: Negative.   Musculoskeletal: Negative.   Skin: Positive for rash.  Neurological: Positive for dizziness and weakness.  Endo/Heme/Allergies: Negative.   Psychiatric/Behavioral: Positive for memory loss.    Past Medical History:  Diagnosis Date  . Dementia   . Diabetes mellitus without complication (HCC)   . History of MI (myocardial infarction)   . Hyperlipidemia   . Hypertension   . PVD (peripheral vascular disease) (HCC)       History reviewed. No pertinent family history.  Social History   Socioeconomic History  . Marital status: Married    Spouse name: Not on file  . Number of children: Not on file  . Years of education: Not on file  . Highest education level: Not on file  Social Needs  . Financial resource strain: Not on file  . Food insecurity - worry: Not on file  . Food insecurity - inability: Not on file  . Transportation needs - medical: Not on file  . Transportation needs - non-medical: Not on file  Occupational History  . Not on file  Tobacco Use  . Smoking status: Former Games developermoker  . Smokeless tobacco: Never Used  Substance and Sexual Activity  . Alcohol use: No  . Drug use: Not on file  . Sexual activity: Not on file  Other Topics Concern  . Not on file  Social History Narrative  . Not on file    Past Surgical History:  Procedure Laterality Date  . CORONARY ANGIOPLASTY    . HERNIA REPAIR       Medications Prior to Admission  Medication Sig Dispense Refill Last Dose  . aspirin 325 MG tablet Take 325 mg by mouth daily.   05/22/2017 at Unknown time  . atorvastatin (LIPITOR) 40 MG tablet Take 40 mg by mouth daily.   05/22/2017 at Unknown time  . donepezil (ARICEPT) 10 MG tablet Take 10 mg by mouth at bedtime as needed.   05/22/2017 at Unknown time  . insulin aspart (NOVOLOG FLEXPEN) 100 UNIT/ML injection Inject 4 Units into the skin 2 (two) times  daily.    05/22/2017 at Unknown time  . Insulin Detemir (LEVEMIR) 100 UNIT/ML Pen Inject 20 Units into the skin at bedtime.    05/22/2017 at Unknown time  . memantine (NAMENDA) 10 MG tablet Take 1 tablet by mouth 2 (two) times daily.   05/22/2017 at Unknown time  . metFORMIN (GLUCOPHAGE) 500 MG tablet Take 500 mg by mouth 2 (two) times daily with a meal.   05/22/2017 at Unknown time  . pantoprazole (PROTONIX) 40 MG tablet Take 1 tablet by mouth daily.   05/22/2017 at Unknown time  . quinapril (ACCUPRIL) 20 MG tablet Take 20 mg by mouth at bedtime.    05/22/2017 at Unknown time  . torsemide (DEMADEX) 20 MG tablet Take 20 mg by mouth daily.   05/22/2017 at Unknown time  . verapamil (VERELAN) 100 MG 24 hr capsule Take 100 mg by mouth at bedtime.    05/22/2017 at Unknown time  . acetaminophen (TYLENOL) 650 MG CR tablet Take 650 mg by mouth every 8 (eight) hours as needed for pain.   prn at prn  . triamcinolone lotion (KENALOG) 0.1 % Apply 1 application topically 2 (two) times daily as needed.   prn at prn    Physical Exam: Blood pressure (!) 103/59, pulse 88, temperature 97.6 F (36.4 C), resp. rate 18, height 5\' 7"  (1.702 m), weight 91.9 kg (202 lb 8 oz), SpO2 95 %.   Wt Readings from Last 1 Encounters:  05/24/17 91.9 kg (202 lb 8 oz)     General appearance: cooperative and slowed mentation Resp: clear to auscultation bilaterally Cardio: Regular rhythm with frequent ectopy GI: soft, non-tender; bowel sounds normal; no masses,  no organomegaly Extremities: edema 2-3+ lower extremity edema bilaterally Skin: excoriation - lower leg(s) bilateral and Excoriations bilaterally. Neurologic: Grossly normal  Labs:   Lab Results  Component Value Date   WBC 11.1 (H) 05/23/2017   HGB 12.0 (L) 05/23/2017   HCT 36.5 (L) 05/23/2017   MCV 91.5 05/23/2017   PLT 170 05/23/2017    Recent Labs  Lab 05/24/17 0132  NA 141  K 3.6  CL 109  CO2 21*  BUN 24*  CREATININE 1.29*  CALCIUM 8.2*  GLUCOSE 97   Lab Results  Component Value Date   CKTOTAL 343 05/24/2017   CKMB 1.1 08/04/2012   TROPONINI 0.32 (HH) 05/24/2017      Radiology: Bibasilar atelectasis or scarring.  No pulmonary edema. EKG:'s rhythm with frequent PVCs with ventricular bigeminy  ASSESSMENT AND PLAN: Patient with history of dementia hypertension diabetes and previous myocardial infarction approximately 30 years ago who presented with several falls at home.  These were unwitnessed.  Is unclear whether or not he had syncope.  He is a difficult historian due to his memory  issues.  He is not aware if he was dizzy prior to the events.  On EKG he is sinus rhythm with ventricular bigeminy.  He has been relatively hypotensive.  He was on ACE inhibitor and long-acting verapamil as an outpatient.  ACE inhibitor has been discontinued.  Patient continues to have retention of PVCs.  Will discontinue verapamil and place on low-dose carvedilol 3.125 mg twice daily.  We will follow his electrolytes and rhythm on telemetry.  Will review echo when available.  Further recommendations after this is complete. Signed: Dalia HeadingKenneth A Puanani Gene MD, [facility] 05/24/2017, 1:13 PM

## 2017-05-24 NOTE — Progress Notes (Signed)
PT Cancellation Note  Patient Details Name: Shaun Davis MRN: [id number] DOB: [birth year]   Cancelled Treatment:    Reason Eval/Treat Not Completed: Other (comment).  PT consult received.  Chart reviewed.  Pt pending B LE US (to r/o DVT) and also with up-trending troponins (pt pending cardiology consult).  Will hold PT at this time and re-attempt PT evaluation at a later date/time as medically appropriate.  Hendricks LimesEmily Darius Lundberg, PT 05/24/17, 9:09 AM [phone]

## 2017-05-24 NOTE — Care Management Note (Signed)
Case Management Note  Patient Details  Name: Wolfgang Phoenixhomas F Ackley MRN: [id number] Date of Birth: [birth year]  Subjective/Objective:                 CM consult for long term care needs.  Patient placed in observation from home.  He fell out of his recliner and unable to recall the event. Uses walker for ambulation. Sleeps in his recliner because it is more comfortable than a bed. Current with his PCP and wife drives him to appointments. No issues obtaining meds.  PT consult is pending   Action/Plan:   Expected Discharge Date:  05/25/17               Expected Discharge Plan:     In-House Referral:     Discharge planning Services     Post Acute Care Choice:    Choice offered to:     DME Arranged:    DME Agency:     HH Arranged:    HH Agency:     Status of Service:     If discussed at [employer] Meetings, dates discussed:    Additional Comments:  Eber HongGreene, Gaylynn Seiple R, RN 05/24/2017, 11:42 AM

## 2017-05-25 DIAGNOSIS — R55 Syncope and collapse: Secondary | ICD-10-CM | POA: Diagnosis not present

## 2017-05-25 LAB — BASIC METABOLIC PANEL
Anion gap: 7 (ref 5–15)
BUN: 18 mg/dL (ref 6–20)
CHLORIDE: 108 mmol/L (ref 101–111)
CO2: 23 mmol/L (ref 22–32)
CREATININE: 0.99 mg/dL (ref 0.61–1.24)
Calcium: 8.3 mg/dL — ABNORMAL LOW (ref 8.9–10.3)
GFR calc Af Amer: >60 mL/min (ref >60)
GFR calc non Af Amer: >60 mL/min (ref >60)
Glucose, Bld: 92 mg/dL (ref 65–99)
Potassium: 4.3 mmol/L (ref 3.5–5.1)
Sodium: 138 mmol/L (ref 135–145)

## 2017-05-25 LAB — ECHOCARDIOGRAM COMPLETE
Area-P 1/2: 4 cm2
E decel time: 187 msec
E/e' ratio: 8.32
FS: 47 % — AB (ref 28–44)
HEIGHTINCHES: 67 in
IVS/LV PW RATIO, ED: 0.83
LA ID, A-P, ES: 43 mm
LA diam end sys: 43 mm
LADIAMINDEX: 2.03 cm/m2
LAVOL: 61.4 mL
LAVOLA4C: 43.9 mL
LAVOLIN: 29 mL/m2
LV E/e'average: 8.32
LV TDI E'MEDIAL: 7.83
LVEEMED: 8.32
LVELAT: 8.16 cm/s
Lateral S' vel: 13.6 cm/s
MV Dec: 187
MVPKAVEL: 103 m/s
MVPKEVEL: 67.9 m/s
P 1/2 time: 55 ms
PW: 12.1 mm — AB (ref 0.6–1.1)
TAPSE: 25.4 mm
TDI e' lateral: 8.16
VTI: 171 cm
WEIGHTICAEL: 3240 [oz_av]

## 2017-05-25 LAB — MISC LABCORP TEST (SEND OUT): LABCORP TEST CODE: 1453

## 2017-05-25 LAB — URINE CULTURE

## 2017-05-25 LAB — MAGNESIUM: Magnesium: 2 mg/dL (ref 1.7–2.4)

## 2017-05-25 LAB — PHOSPHORUS: Phosphorus: 3.4 mg/dL (ref 2.5–4.6)

## 2017-05-25 LAB — GLUCOSE, CAPILLARY
GLUCOSE-CAPILLARY: 130 mg/dL — AB (ref 65–99)
Glucose-Capillary: 78 mg/dL (ref 65–99)
Glucose-Capillary: 110 mg/dL — ABNORMAL HIGH (ref 65–99)

## 2017-05-25 MED ORDER — CARVEDILOL 3.125 MG PO TABS: 3.1250 mg | ORAL_TABLET | Freq: Two times a day (BID) | ORAL | 0 refills | Status: AC

## 2017-05-25 MED ORDER — CEPHALEXIN 250 MG PO CAPS: 250.0000 mg | ORAL_CAPSULE | Freq: Three times a day (TID) | ORAL | 0 refills | Status: AC

## 2017-05-25 NOTE — Discharge Instructions (Signed)
Regular diet

## 2017-05-25 NOTE — Progress Notes (Signed)
MEDICATION RELATED CONSULT NOTE  Pharmacy Consult for electrolyte management Indication: syncope  Allergies  Allergen Reactions  . Penicillins Rash    Has patient had a PCN reaction causing immediate rash, facial/tongue/throat swelling, SOB or lightheadedness with hypotension: Yes Has patient had a PCN reaction causing severe rash involving mucus membranes or skin necrosis: No Has patient had a PCN reaction that required hospitalization: No Has patient had a PCN reaction occurring within the last 10 years: No If all of the above answers are "NO", then may proceed with Cephalosporin use.    Patient Measurements: Height: 5\' 7"  (170.2 cm) Weight: 202 lb 1.6 oz (91.7 kg) IBW/kg (Calculated) : 66.1 Adjusted Body Weight:   Vital Signs: Temp: 97.8 F (36.6 C) (12/04 0734) Temp Source: Oral (12/04 0331) BP: 110/52 (12/04 0734) Pulse Rate: 43 (12/04 0734) Intake/Output from previous day: 12/03 0701 - 12/04 0700 In: 960 [P.O.:960] Out: 800 [Urine:800] Intake/Output from this shift: No intake/output data recorded.  Labs: Recent Labs    05/23/17 0926 05/24/17 0132 05/24/17 0416 05/25/17 0428  WBC 11.1*  --   --   --   HGB 12.0*  --   --   --   HCT 36.5*  --   --   --   PLT 170  --   --   --   APTT  --   --  33  --   CREATININE 1.35* 1.29*  --  0.99  MG 1.4* 2.2  --  2.0  PHOS 2.4* 3.9  --  3.4    Lab Results  Component Value Date   K 4.3 05/25/2017   Estimated Creatinine Clearance: 62.1 mL/min (by C-G formula based on SCr of 0.99 mg/dL).   Microbiology: No results found for this or any previous visit (from the past 720 hour(s)).  Medical History: Past Medical History:  Diagnosis Date  . Dementia   . Diabetes mellitus without complication (HCC)   . History of MI (myocardial infarction)   . Hyperlipidemia   . Hypertension   . PVD (peripheral vascular disease) (HCC)     Medications:  Infusions:   Assessment: 84 yom cc weakness. Family found him on the  floor, difficult to arouse. PMH dementia, DM, hx MI, HLD, HTN, PVD. Pharmacy consulted to manage electrolytes.   Goal of Therapy:  Electrolytes WNL  Plan:  K 4.5 this morning. Will check add-on magnesium/phosphorus and replete per protocol.  14:51 phos returned 2.4 Give K Phos Neutral tab 500 mg po Q4H x 2 doses. Magnesium level still pending - spoke with lab - they will run as add-on. Recheck all electrolytes tomorrow with AM labs.  15:26 magnesium returned 1.4. Will give 4 gm IV x 1 per protocol and recheck all electrolytes tomorrow with AM labs.   12/3  K 3.6, Mag 2.2  Phos 3.9  Electrolytes WNL. F/u am labs.  12/4 K 4.3, Mag 2.0 Phos 3.4 Electrolytes WNL. F/u am labs.  Bowman Higbie A, Pharm.D., BCPS Clinical Pharmacist 05/25/2017,7:47 AM

## 2017-05-25 NOTE — Plan of Care (Signed)
  Progressing Safety: Ability to remain free from injury will improve 05/25/2017 0635 - Progressing by Stefan Churchogers, Tynlee Bayle M, RN

## 2017-05-25 NOTE — Care Management (Signed)
Spoke with patient and his wife.  Physical therapy is recommending home health services.  Patient and his wife are in agreement.  Had home health 4 years ago and agreeable to receive home health nurse, physical therapy, aide and occupational therapy.  Patient of late has become less active- doing less for himself. Found it was [facility] that followed patient.  Requested order for home health SN PT OT Aide and SW.  Referral accepted

## 2017-05-25 NOTE — Clinical Social Work Note (Signed)
CSW received referral that patient's wife Corrie DandyMary was interested in finding out about ALFs and SNFs, CSW gave patient's wife a list of different facilities, she will research look it over.  Case manager also set up home health social worker to go to patient's home to assist with possible placement later.  CSW signing off, please reconsult if other social work needs arise.  Ervin KnackEric R. Selim Durden, MSW, Theresia MajorsLCSWA [phone]  05/25/2017 4:54 PM

## 2017-05-25 NOTE — Plan of Care (Signed)
  Clinical Measurements: Will remain free from infection 05/25/2017 1416 - Progressing by Raynald BlendImhoff, Avarose Mervine M, RN Note Patient continuing to receive doses of IV Cleocin. Will continue to monitor improvement of symptoms. Jari FavreSteven M [facility] Hospital[facility]

## 2017-05-25 NOTE — Evaluation (Signed)
Physical Therapy Evaluation Patient Details Name: Shaun Davis MRN: [id number] DOB: 01-03-1933 Today's Date: 05/25/2017   History of Present Illness  Pt is an 83 y.o. male presenting to hospital after 2 possible syncopal vs falls at home; also noted with B LE cellulitis with chronic PVD, relatively hypotensive, and sinus rhythm with ventricular bigeminy.  Clinical Impression  Prior to hospital admission, pt was ambulating short household distances (around 30 feet with RW) and navigating 2 steps with RW in home in order to go to the kitchen and bathroom (pt stays in den and sleeps in lift chair).  Pt lives with his wife in 1 level home.  Currently pt is min assist supine to sit and CGA with standing and walking a few feet with RW.  Limited distance ambulating d/t pt's concerns with LE strength but pt did fairly well with above mobility and LE's appeared strong with standing and taking steps.  Pt would benefit from skilled PT to address noted impairments and functional limitations (see below for any additional details).  Upon hospital discharge, recommend pt discharge to home with 24/7 assist as needed and HHPT.    Follow Up Recommendations Home health PT;Supervision/Assistance - 24 hour    Equipment Recommendations  Rolling walker with 5" wheels(pt already has RW at home)    Recommendations for Other Services       Precautions / Restrictions Precautions Precautions: Fall Restrictions Weight Bearing Restrictions: No      Mobility  Bed Mobility Overal bed mobility: Needs Assistance Bed Mobility: Supine to Sit     Supine to sit: Min assist     General bed mobility comments: initial help for LE's towards edge of bed and then pt able to sit up on own with use of bedrail  Transfers Overall transfer level: Needs assistance Equipment used: Rolling walker (2 wheeled) Transfers: Sit to/from [employer] Sit to Stand: Min guard Stand pivot transfers: Min guard        General transfer comment: increased effort and time to stand from bed; mild increased effort to stand from [facility]  Ambulation/Gait Ambulation/Gait assistance: Min guard Ambulation Distance (Feet): 3 Feet(bed to [facility]) Assistive device: Rolling walker (2 wheeled)   Gait velocity: decreased   General Gait Details: decreased B step length; steady  Stairs            Wheelchair Mobility    Modified Rankin (Stroke Patients Only)       Balance Overall balance assessment: Needs assistance;History of Falls Sitting-balance support: Feet supported Sitting balance-Leahy Scale: Good Sitting balance - Comments: sitting reaching within BOS steady   Standing balance support: Single extremity supported Standing balance-Leahy Scale: Poor Standing balance comment: requires at least single UE support for standing balance                             Pertinent Vitals/Pain Pain Assessment: Faces Faces Pain Scale: No hurt Pain Intervention(s): Limited activity within patient's tolerance;Monitored during session;Repositioned  Vitals (HR and O2 on room air) stable and WFL throughout treatment session.    Home Living Family/patient expects to be discharged to:: Private residence Living Arrangements: Spouse/significant other Available Help at Discharge: Family Type of Home: House       Home Layout: One level Home Equipment: Walker - 2 wheels;Grab bars - toilet;Grab bars - tub/shower Additional Comments: 2 steps no railing from den to kitchen (and then level surface to bathroom)--pt uses RW for stairs  Prior Function Level of Independence: Needs assistance   Gait / Transfers Assistance Needed: Pt normally sleeps in lift chair and sits in lift chair most of the time in Den (lift chair recently broken but pt's wife reports it was fixed last night).  Normally walks around 30 feet with RW.  ADL's / Homemaking Assistance Needed: Pt's wife reports having difficulty getting pt to  agree to bathing at home.  Comments: Has home visits from MD (pt normally does not leave the house).     Hand Dominance        Extremity/Trunk Assessment   Upper Extremity Assessment Upper Extremity Assessment: Generalized weakness    Lower Extremity Assessment Lower Extremity Assessment: Generalized weakness       Communication   Communication: No difficulties  Cognition Arousal/Alertness: Awake/alert Behavior During Therapy: WFL for tasks assessed/performed Overall Cognitive Status: (Oriented to self)                                        General Comments General comments (skin integrity, edema, etc.): Pt's wife present during session.  Nursing cleared pt for participation in physical therapy.  Pt agreeable to PT session.  Pt's wife present during session and encouraging pt to use BSC instead of bed pan.    Exercises  Transfer training   Assessment/Plan    PT Assessment Patient needs continued PT services  PT Problem List Decreased strength;Decreased activity tolerance;Decreased balance;Decreased mobility       PT Treatment Interventions DME instruction;Gait training;Stair training;Functional mobility training;Therapeutic activities;Therapeutic exercise;Patient/family education;Balance training    PT Goals (Current goals can be found in the Care Plan section)  Acute Rehab PT Goals Patient Stated Goal: to be able to walk  PT Goal Formulation: With patient Time For Goal Achievement: 06/08/17 Potential to Achieve Goals: Good    Frequency Min 2X/week   Barriers to discharge        Co-evaluation               AM-PAC PT "6 Clicks" Daily Activity  Outcome Measure Difficulty turning over in bed (including adjusting bedclothes, sheets and blankets)?: Unable Difficulty moving from lying on back to sitting on the side of the bed? : Unable Difficulty sitting down on and standing up from a chair with arms (e.g., wheelchair, bedside commode,  etc,.)?: Unable Help needed moving to and from a bed to chair (including a wheelchair)?: A Little Help needed walking in hospital room?: A Little Help needed climbing 3-5 steps with a railing? : A Little 6 Click Score: 12    End of Session Equipment Utilized During Treatment: Gait belt Activity Tolerance: Patient tolerated treatment well Patient left: in chair;with call bell/phone within reach;with chair alarm set;with family/visitor present(B LE's elevated on pillow) Nurse Communication: Mobility status;Precautions;Other (comment)(Chair alarm pad in place and activated into green box (turned on and working) but need for cord (from green box to call system)) PT Visit Diagnosis: Other abnormalities of gait and mobility (R26.89);History of falling (Z91.81);Muscle weakness (generalized) (M62.81)    Time: [id number] PT Time Calculation (min) (ACUTE ONLY): 35 min   Charges:   PT Evaluation $PT Eval Low Complexity: 1 Low PT Treatments $Therapeutic Activity: 8-22 mins   PT G Codes:   PT G-Codes **NOT FOR INPATIENT CLASS** Functional Assessment Tool Used: AM-PAC 6 Clicks Basic Mobility Functional Limitation: Mobility: Walking and moving around Mobility: Walking and Moving Around Current  Status [phone](G8978): At least 60 percent but less than 80 percent impaired, limited or restricted Mobility: Walking and Moving Around Goal Status [phone](G8979): At least 1 percent but less than 20 percent impaired, limited or restricted    Hendricks Limesmily Tyion Boylen, PT 05/25/17, 1:53 PM [phone]

## 2017-05-29 NOTE — Discharge Summary (Signed)
[facility] [place] [facility]   PATIENT NAME: Shaun Davis    MR#:  [id number]  DATE OF BIRTH:  July 22, 1932  DATE OF ADMISSION:  05/23/2017 ADMITTING PHYSICIAN: Ramonita Lab, MD  DATE OF DISCHARGE: 05/25/2017  4:37 PM  PRIMARY CARE PHYSICIAN: Jaclyn Shaggy, MD   ADMISSION DIAGNOSIS:  Syncope and collapse [R55] Back pain [M54.9] Elevated troponin I level [R74.8] Frequent unifocal PVCs [I49.3] Fall, initial encounter [W19.XXXA] Lumbar contusion, initial encounter [S30.0XXA]  DISCHARGE DIAGNOSIS:  Active Problems:   Syncope   SECONDARY DIAGNOSIS:   Past Medical History:  Diagnosis Date  . Dementia   . Diabetes mellitus without complication (HCC)   . History of MI (myocardial infarction)   . Hyperlipidemia   . Hypertension   . PVD (peripheral vascular disease) (HCC)      ADMITTING HISTORY  HISTORY OF PRESENT ILLNESS:  Shaun Davis  is a 83 y.o. male with a known history of dementia, isn't requiring diabetes mellitus, peripheral vascular disease, essential hypertension and hyperlipidemia is brought into the ED after patient had 2 syncopal episodes today. Patient chronically sleeps in the chair, and today morning wife found him on the floor at around 7:30 AM and doesn't know the down time. Patient could not recall being dizzy and falling onto the floor. EMS came but patient refused to come to the hospital. Patient has sustained another fall at around 9:00 and wife insisted him to come to the hospital. CT head is negative. Initial troponin at 0.04. Patient denies any dizziness or chest pain, in fact he is a poor historian from his dementia.Marland Kitchen He has lower extremity swelling and redness, according to the wife the patient has peripheral vascular disease is redness and worsening of the swelling is new. EKG with multiple PVCs     HOSPITAL COURSE:   * Recurrent falls versus syncope.  Patient is poor historian.  Unclear history.  Has bigeminy on telemetry.   Verapamil changed to Coreg.  Appreciate cardiology input. Echocardiogram done and showed no significant valvular abnormalities.  Normal ejection fraction. Mild elevation in troponin likely due to falls and elevated CK. Stopped heparin drip. Discussed with Dr. Lady Gary prior to discharge. It is unclear if patient actually had syncope or fall due to his gait abnormalities.  Discussed with wife at bedside.  *Hypertension.  Patient has been hypotensive here.  We will stop ACE inhibitor.  Verapamil changed to Coreg.  *Dementia.    Stable  Patient discharged home in stable condition to follow-up with his primary care physician in 1 week.    CONSULTS OBTAINED:  Treatment Team:  Dalia Heading, MD  DRUG ALLERGIES:   Allergies  Allergen Reactions  . Penicillins Rash    Has patient had a PCN reaction causing immediate rash, facial/tongue/throat swelling, SOB or lightheadedness with hypotension: Yes Has patient had a PCN reaction causing severe rash involving mucus membranes or skin necrosis: No Has patient had a PCN reaction that required hospitalization: No Has patient had a PCN reaction occurring within the last 10 years: No If all of the above answers are "NO", then may proceed with Cephalosporin use.    DISCHARGE MEDICATIONS:   Allergies as of 05/25/2017      Reactions   Penicillins Rash   Has patient had a PCN reaction causing immediate rash, facial/tongue/throat swelling, SOB or lightheadedness with hypotension: Yes Has patient had a PCN reaction causing severe rash involving mucus membranes or skin necrosis: No Has patient had a PCN reaction  that required hospitalization: No Has patient had a PCN reaction occurring within the last 10 years: No If all of the above answers are "NO", then may proceed with Cephalosporin use.      Medication List    STOP taking these medications   quinapril 20 MG tablet Commonly known as:  ACCUPRIL   verapamil 100 MG 24 hr capsule Commonly  known as:  VERELAN     TAKE these medications   acetaminophen 650 MG CR tablet Commonly known as:  TYLENOL Take 650 mg by mouth every 8 (eight) hours as needed for pain.   aspirin 325 MG tablet Take 325 mg by mouth daily.   atorvastatin 40 MG tablet Commonly known as:  LIPITOR Take 40 mg by mouth daily.   carvedilol 3.125 MG tablet Commonly known as:  COREG Take 1 tablet (3.125 mg total) by mouth 2 (two) times daily with a meal.   cephALEXin 250 MG capsule Commonly known as:  KEFLEX Take 1 capsule (250 mg total) by mouth 3 (three) times daily for 4 days.   donepezil 10 MG tablet Commonly known as:  ARICEPT Take 10 mg by mouth at bedtime as needed.   Insulin Detemir 100 UNIT/ML Pen Commonly known as:  LEVEMIR Inject 20 Units into the skin at bedtime.   memantine 10 MG tablet Commonly known as:  NAMENDA Take 1 tablet by mouth 2 (two) times daily.   metFORMIN 500 MG tablet Commonly known as:  GLUCOPHAGE Take 500 mg by mouth 2 (two) times daily with a meal.   NOVOLOG FLEXPEN 100 UNIT/ML injection Generic drug:  insulin aspart Inject 4 Units into the skin 2 (two) times daily.   pantoprazole 40 MG tablet Commonly known as:  PROTONIX Take 1 tablet by mouth daily.   torsemide 20 MG tablet Commonly known as:  DEMADEX Take 20 mg by mouth daily.   triamcinolone lotion 0.1 % Commonly known as:  KENALOG Apply 1 application topically 2 (two) times daily as needed.       Today   VITAL SIGNS:  Blood pressure (!) 110/52, pulse 85, temperature 97.8 F (36.6 C), resp. rate 17, height 5\' 7"  (1.702 m), weight 91.7 kg (202 lb 1.6 oz), SpO2 93 %.  I/O:  No intake or output data in the 24 hours ending 05/29/17 1328  PHYSICAL EXAMINATION:  Physical Exam  GENERAL:  83 y.o.-year-old patient lying in the bed with no acute distress.  LUNGS: Normal breath sounds bilaterally, no wheezing, rales,rhonchi or crepitation. No use of accessory muscles of respiration.   CARDIOVASCULAR: S1, S2 normal. No murmurs, rubs, or gallops.  ABDOMEN: Soft, non-tender, non-distended. Bowel sounds present. No organomegaly or mass.  NEUROLOGIC: Moves all 4 extremities. PSYCHIATRIC: The patient is alert and awake.  Pleasantly confused. SKIN: No obvious rash, lesion, or ulcer.   DATA REVIEW:   CBC Recent Labs  Lab 05/23/17 0926  WBC 11.1*  HGB 12.0*  HCT 36.5*  PLT 170    Chemistries  Recent Labs  Lab 05/25/17 0428  NA 138  K 4.3  CL 108  CO2 23  GLUCOSE 92  BUN 18  CREATININE 0.99  CALCIUM 8.3*  MG 2.0    Cardiac Enzymes Recent Labs  Lab 05/24/17 0829  TROPONINI 0.32*    Microbiology Results  Results for orders placed or performed during the hospital encounter of 05/23/17  Urine culture     Status: Abnormal   Collection Time: 05/23/17 10:23 AM  Result Value Ref Range Status  Specimen Description URINE, CLEAN CATCH  Final   Special Requests NONE  Final   Culture (A)  Final    <10,000 COLONIES/mL INSIGNIFICANT GROWTH Performed at [facility], [address] [place], [place] [address]    Report Status 05/25/2017 FINAL  Final    RADIOLOGY:  No results found.  Follow up with PCP in 1 week.  Management plans discussed with the patient, family and they are in agreement.  CODE STATUS:  Code Status History    Date Active Date Inactive Code Status Order ID Comments User Context   05/23/2017 15:49 05/25/2017 19:37 Full Code [id number]  Ramonita LabGouru, Aruna, MD Inpatient    Advance Directive Documentation     Most Recent Value  Type of Advance Directive  Healthcare Power of Attorney, Living will  Pre-existing out of facility DNR order (yellow form or pink MOST form)  No data  "MOST" Form in Place?  No data      TOTAL TIME TAKING CARE OF THIS PATIENT ON DAY OF DISCHARGE: more than 30 minutes.   Molinda BailiffSrikar R Neola Worrall M.D on 05/29/2017 at 1:28 PM  Between 7am to 6pm - Pager - [phone]  After 6pm go to [email or web address] - password [email or web address]  [facility]  [facility] [place] [facility]  Office  [phone]  CC: Primary care physician; Jaclyn Shaggyate, Denny C, MD  Note: This dictation was prepared with Dragon dictation along with smaller phrase technology. Any transcriptional errors that result from this process are unintentional.

## 2017-06-21 ENCOUNTER — Encounter: Payer: Self-pay | Admitting: Emergency Medicine

## 2017-06-21 ENCOUNTER — Emergency Department
Admission: EM | Admit: 2017-06-21 | Discharge: 2017-06-21 | Disposition: A | Discharge status: Home / Self Care | Payer: Medicare Other | Attending: Emergency Medicine | Admitting: Emergency Medicine

## 2017-06-21 ENCOUNTER — Emergency Department: Payer: Medicare Other

## 2017-06-21 ENCOUNTER — Other Ambulatory Visit: Payer: Self-pay

## 2017-06-21 DIAGNOSIS — M545 Low back pain, unspecified: Secondary | ICD-10-CM

## 2017-06-21 DIAGNOSIS — Z7982 Long term (current) use of aspirin: Secondary | ICD-10-CM | POA: Diagnosis not present

## 2017-06-21 DIAGNOSIS — W19XXXA Unspecified fall, initial encounter: Secondary | ICD-10-CM | POA: Diagnosis not present

## 2017-06-21 DIAGNOSIS — E119 Type 2 diabetes mellitus without complications: Secondary | ICD-10-CM | POA: Diagnosis not present

## 2017-06-21 DIAGNOSIS — Z794 Long term (current) use of insulin: Secondary | ICD-10-CM | POA: Diagnosis not present

## 2017-06-21 DIAGNOSIS — Z79899 Other long term (current) drug therapy: Secondary | ICD-10-CM | POA: Insufficient documentation

## 2017-06-21 DIAGNOSIS — E1151 Type 2 diabetes mellitus with diabetic peripheral angiopathy without gangrene: Secondary | ICD-10-CM | POA: Insufficient documentation

## 2017-06-21 DIAGNOSIS — Z87891 Personal history of nicotine dependence: Secondary | ICD-10-CM | POA: Diagnosis not present

## 2017-06-21 DIAGNOSIS — I10 Essential (primary) hypertension: Secondary | ICD-10-CM | POA: Insufficient documentation

## 2017-06-21 DIAGNOSIS — F0390 Unspecified dementia without behavioral disturbance: Secondary | ICD-10-CM | POA: Insufficient documentation

## 2017-06-21 DIAGNOSIS — I252 Old myocardial infarction: Secondary | ICD-10-CM | POA: Diagnosis not present

## 2017-06-21 LAB — COMPREHENSIVE METABOLIC PANEL
ALBUMIN: 3.2 g/dL — AB (ref 3.5–5.0)
ALK PHOS: 170 U/L — AB (ref 38–126)
ALT: 12 U/L — AB (ref 17–63)
ANION GAP: 14 (ref 5–15)
AST: 37 U/L (ref 15–41)
BUN: 18 mg/dL (ref 6–20)
CALCIUM: 8.8 mg/dL — AB (ref 8.9–10.3)
CHLORIDE: 102 mmol/L (ref 101–111)
CO2: 23 mmol/L (ref 22–32)
Creatinine, Ser: 1.49 mg/dL — ABNORMAL HIGH (ref 0.61–1.24)
GFR calc Af Amer: 49 mL/min — ABNORMAL LOW (ref >60)
GFR calc non Af Amer: 42 mL/min — ABNORMAL LOW (ref >60)
GLUCOSE: 152 mg/dL — AB (ref 65–99)
Potassium: 3.8 mmol/L (ref 3.5–5.1)
SODIUM: 139 mmol/L (ref 135–145)
Total Bilirubin: 0.8 mg/dL (ref 0.3–1.2)
Total Protein: 6.4 g/dL — ABNORMAL LOW (ref 6.5–8.1)

## 2017-06-21 LAB — CBC WITH DIFFERENTIAL/PLATELET
Basophils Absolute: 0.1 10*3/uL (ref 0–0.1)
Basophils Relative: 1 %
EOS ABS: 0.1 10*3/uL (ref 0–0.7)
Eosinophils Relative: 2 %
HEMATOCRIT: 36.5 % — AB (ref 40.0–52.0)
HEMOGLOBIN: 11.8 g/dL — AB (ref 13.0–18.0)
LYMPHS ABS: 1.1 10*3/uL (ref 1.0–3.6)
LYMPHS PCT: 15 %
MCH: 29.2 pg (ref 26.0–34.0)
MCHC: 32.2 g/dL (ref 32.0–36.0)
MCV: 90.6 fL (ref 80.0–100.0)
Monocytes Absolute: 0.7 10*3/uL (ref 0.2–1.0)
Monocytes Relative: 10 %
NEUTROS PCT: 72 %
Neutro Abs: 5.2 10*3/uL (ref 1.4–6.5)
Platelets: 211 10*3/uL (ref 150–440)
RBC: 4.03 MIL/uL — AB (ref 4.40–5.90)
RDW: 14.2 % (ref 11.5–14.5)
WBC: 7.2 10*3/uL (ref 3.8–10.6)

## 2017-06-21 LAB — TROPONIN I: Troponin I: <0.03 ng/mL (ref <0.03)

## 2017-06-21 NOTE — ED Triage Notes (Signed)
Pt to ED via EMS from home c/o mechanical fall per family.  EMS states no LOC, daily ASA, mid back pain.  EMS vitals 119/76 BP, 78 HR, 96% RA, 151 CBG.  Pt has a hx dementia alert and oriented to place and self, disoriented to time.

## 2017-06-21 NOTE — Discharge Instructions (Signed)
Fortunately today your XR was negative for fracture and your blood work was reassuring.  Please make an appointment to follow-up with your primary care physician within the next several days for reevaluation and return to the emergency department sooner for any concerns.  It was a pleasure to take care of you today, and thank you for coming to our emergency department.  If you have any questions or concerns before leaving please ask the nurse to grab me and I'm more than happy to go through your aftercare instructions again.  If you were prescribed any opioid pain medication today such as Norco, Vicodin, Percocet, morphine, hydrocodone, or oxycodone please make sure you do not drive when you are taking this medication as it can alter your ability to drive safely.  If you have any concerns once you are home that you are not improving or are in fact getting worse before you can make it to your follow-up appointment, please do not hesitate to call 911 and come back for further evaluation.  Merrily Brittle, MD  Results for orders placed or performed during the hospital encounter of 06/21/17  Comprehensive metabolic panel  Result Value Ref Range   Sodium 139 135 - 145 mmol/L   Potassium 3.8 3.5 - 5.1 mmol/L   Chloride 102 101 - 111 mmol/L   CO2 23 22 - 32 mmol/L   Glucose, Bld 152 (H) 65 - 99 mg/dL   BUN 18 6 - 20 mg/dL   Creatinine, Ser 6.96 (H) 0.61 - 1.24 mg/dL   Calcium 8.8 (L) 8.9 - 10.3 mg/dL   Total Protein 6.4 (L) 6.5 - 8.1 g/dL   Albumin 3.2 (L) 3.5 - 5.0 g/dL   AST 37 15 - 41 U/L   ALT 12 (L) 17 - 63 U/L   Alkaline Phosphatase 170 (H) 38 - 126 U/L   Total Bilirubin 0.8 0.3 - 1.2 mg/dL   GFR calc non Af Amer 42 (L) >60 mL/min   GFR calc Af Amer 49 (L) >60 mL/min   Anion gap 14 5 - 15  Troponin I  Result Value Ref Range   Troponin I <0.03 <0.03 ng/mL  CBC with Differential  Result Value Ref Range   WBC 7.2 3.8 - 10.6 K/uL   RBC 4.03 (L) 4.40 - 5.90 MIL/uL   Hemoglobin 11.8 (L)  13.0 - 18.0 g/dL   HCT 29.5 (L) 28.4 - 13.2 %   MCV 90.6 80.0 - 100.0 fL   MCH 29.2 26.0 - 34.0 pg   MCHC 32.2 32.0 - 36.0 g/dL   RDW 44.0 10.2 - 72.5 %   Platelets 211 150 - 440 K/uL   Neutrophils Relative % 72 %   Neutro Abs 5.2 1.4 - 6.5 K/uL   Lymphocytes Relative 15 %   Lymphs Abs 1.1 1.0 - 3.6 K/uL   Monocytes Relative 10 %   Monocytes Absolute 0.7 0.2 - 1.0 K/uL   Eosinophils Relative 2 %   Eosinophils Absolute 0.1 0 - 0.7 K/uL   Basophils Relative 1 %   Basophils Absolute 0.1 0 - 0.1 K/uL   Ct Abdomen Pelvis Wo Contrast  Result Date: 05/23/2017 CLINICAL DATA:  Fall.  Low back pain. EXAM: CT ABDOMEN AND PELVIS WITHOUT CONTRAST TECHNIQUE: Multidetector CT imaging of the abdomen and pelvis was performed following the standard protocol without IV contrast. COMPARISON:  08/16/2013 FINDINGS: Lower chest: Mild cardiomegaly. No confluent opacities or effusions. Suspect COPD changes in lung bases. Hepatobiliary: Small layering stones in the  gallbladder and possibly within the cystic duct. Gallbladder is otherwise unremarkable. No focal hepatic abnormality. Pancreas: No focal abnormality or ductal dilatation. Spleen: No focal abnormality.  Normal size. Adrenals/Urinary Tract: No adrenal abnormality. No focal renal abnormality. No stones or hydronephrosis. Urinary bladder is unremarkable. Stomach/Bowel: Sigmoid diverticulosis. No active diverticulitis. Stomach and small bowel decompressed, unremarkable. Vascular/Lymphatic: Aortic and iliac calcifications. No evidence of aneurysm or adenopathy. Reproductive: Prostate enlargement. Other: No free fluid or free air. Bilateral inguinal hernias containing fat. Musculoskeletal: Abnormal appearance of the right iliac bone with coarsened trabecula and cortical thickening, suggesting Paget's disease. This is unchanged. IMPRESSION: Cholelithiasis.  No CT evidence for acute cholecystitis. Sigmoid diverticulosis. Aortoiliac atherosclerosis. Bilateral inguinal  hernias containing fat. Prostate enlargement. 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 Electronically Signed   By: Charlett NoseKevin  Dover M.D.   On: 05/23/2017 10:04   Dg Chest 2 View  Result Date: 05/23/2017 CLINICAL DATA:  Increasing weakness. EXAM: CHEST  2 VIEW COMPARISON:  08/17/2013 FINDINGS: Patient rotated to the left. AP and lateral views of the chest show hyperexpansion with chronic interstitial coarsening. Cardiopericardial silhouette is at upper limits of normal for size. Subsegmental atelectasis or linear scarring noted both lung bases. Bones are diffusely demineralized. Telemetry leads overlie the chest. IMPRESSION: Bibasilar atelectasis or scarring. Electronically Signed   By: Kennith CenterEric  Mansell M.D.   On: 05/23/2017 11:08   Dg Lumbar Spine Complete  Result Date: 06/21/2017 CLINICAL DATA:  Low back pain after a fall. EXAM: LUMBAR SPINE - COMPLETE 4+ VIEW COMPARISON:  CT abdomen and pelvis 05/23/2017 FINDINGS: Diffuse bone demineralization. Five lumbar type vertebral bodies. Lumbar scoliosis convex towards the right. Diffuse degenerative change throughout the lumbar spine with narrowed interspaces and endplate hypertrophic changes throughout. No vertebral compression deformities. No focal bone lesion or bone destruction. Degenerative changes in the facet joints. Visualized sacrum appears intact. Vascular calcifications. IMPRESSION: Diffuse bone demineralization and degenerative change throughout the lumbar spine. Scoliosis convex towards the right. No acute displaced fractures identified. Aortic atherosclerosis. Electronically Signed   By: Burman NievesWilliam  Stevens M.D.   On: 06/21/2017 22:20   Ct Head Wo Contrast  Result Date: 05/23/2017 CLINICAL DATA:  Fall. EXAM: CT HEAD WITHOUT CONTRAST TECHNIQUE: Contiguous axial images were obtained from the base of the skull through the vertex without intravenous contrast. COMPARISON:  10/23/2010. FINDINGS: Brain: There is no evidence for acute hemorrhage, hydrocephalus,  mass lesion, or abnormal extra-axial fluid collection. No definite CT evidence for acute infarction. Diffuse loss of parenchymal volume is consistent with atrophy. Patchy low attenuation in the deep hemispheric and periventricular white matter is nonspecific, but likely reflects chronic microvascular ischemic demyelination. Vascular: No hyperdense vessel or unexpected calcification. Skull: No evidence for fracture. No worrisome lytic or sclerotic lesion. Sinuses/Orbits: The visualized paranasal sinuses and mastoid air cells are clear. Visualized portions of the globes and intraorbital fat are unremarkable. Other: None. IMPRESSION: 1. No acute intracranial abnormality. 2. Atrophy with chronic small vessel white matter ischemic disease. Electronically Signed   By: Kennith CenterEric  Mansell M.D.   On: 05/23/2017 10:07   [place] Venous Img Lower Bilateral  Result Date: 05/24/2017 CLINICAL DATA:  83 year old male with a history of swelling EXAM: BILATERAL LOWER EXTREMITY VENOUS DOPPLER ULTRASOUND TECHNIQUE: Gray-scale sonography with graded compression, as well as color Doppler and duplex ultrasound were performed to evaluate the lower extremity deep venous systems from the level of the common femoral vein and including the common femoral, femoral, profunda femoral, popliteal and calf veins including the posterior tibial, peroneal and  gastrocnemius veins when visible. The superficial great saphenous vein was also interrogated. Spectral Doppler was utilized to evaluate flow at rest and with distal augmentation maneuvers in the common femoral, femoral and popliteal veins. COMPARISON:  None. FINDINGS: RIGHT LOWER EXTREMITY Common Femoral Vein: No evidence of thrombus. Normal compressibility, respiratory phasicity and response to augmentation. Saphenofemoral Junction: No evidence of thrombus. Normal compressibility and flow on color Doppler imaging. Profunda Femoral Vein: No evidence of thrombus. Normal compressibility and flow on color  Doppler imaging. Femoral Vein: No evidence of thrombus. Normal compressibility, respiratory phasicity and response to augmentation. Popliteal Vein: No evidence of thrombus. Normal compressibility, respiratory phasicity and response to augmentation. Calf Veins: No evidence of thrombus. Normal compressibility and flow on color Doppler imaging. Superficial Great Saphenous Vein: No evidence of thrombus. Normal compressibility and flow on color Doppler imaging. Other Findings:  None. LEFT LOWER EXTREMITY Common Femoral Vein: No evidence of thrombus. Normal compressibility, respiratory phasicity and response to augmentation. Saphenofemoral Junction: No evidence of thrombus. Normal compressibility and flow on color Doppler imaging. Profunda Femoral Vein: No evidence of thrombus. Normal compressibility and flow on color Doppler imaging. Femoral Vein: No evidence of thrombus. Normal compressibility, respiratory phasicity and response to augmentation. Popliteal Vein: No evidence of thrombus. Normal compressibility, respiratory phasicity and response to augmentation. Calf Veins: No evidence of thrombus. Normal compressibility and flow on color Doppler imaging. Superficial Great Saphenous Vein: No evidence of thrombus. Normal compressibility and flow on color Doppler imaging. Other Findings:  None. IMPRESSION: Sonographic survey bilateral lower extremities negative for DVT Electronically Signed   By: Gilmer MorJaime  Wagner D.O.   On: 05/24/2017 10:29

## 2017-06-21 NOTE — ED Notes (Signed)
Patient transported to X-ray 

## 2017-06-21 NOTE — ED Provider Notes (Signed)
[facility] Emergency Department Provider Note  ____________________________________________   First MD Initiated Contact with Patient 06/21/17 2139     (approximate)  I have reviewed the triage vital signs and the nursing notes.   HISTORY  Chief Complaint Fall  Level 5 exemption history limited by the patient's Alzheimer's disease  HPI Shaun Davis is a 83 y.o. male who comes to the emergency department by EMS after apparently having a fall while at home.  He was walking with a walker but fell for unknown reason falling backwards landing on his bottom and he has had low back pain ever since.  Past Medical History:  Diagnosis Date  . Dementia   . Diabetes mellitus without complication (HCC)   . History of MI (myocardial infarction)   . Hyperlipidemia   . Hypertension   . PVD (peripheral vascular disease) [facility](HCC)     Patient Active Problem List   Diagnosis Date Noted  . Syncope 05/23/2017    Past Surgical History:  Procedure Laterality Date  . CORONARY ANGIOPLASTY    . HERNIA REPAIR      Prior to Admission medications   Medication Sig Start Date End Date Taking? Authorizing Provider  acetaminophen (TYLENOL) 650 MG CR tablet Take 650 mg by mouth every 8 (eight) hours as needed for pain.    [provider]  aspirin 325 MG tablet Take 325 mg by mouth daily.    [provider]  atorvastatin (LIPITOR) 40 MG tablet Take 40 mg by mouth daily.    [provider]  carvedilol (COREG) 3.125 MG tablet Take 1 tablet (3.125 mg total) by mouth 2 (two) times daily with a meal. 05/25/17   Sudini, Srikar, MD  donepezil (ARICEPT) 10 MG tablet Take 10 mg by mouth at bedtime as needed.    [provider]  insulin aspart (NOVOLOG FLEXPEN) 100 UNIT/ML injection Inject 4 Units into the skin 2 (two) times daily.     [provider]  Insulin Detemir (LEVEMIR) 100 UNIT/ML Pen Inject 20 Units into the skin at bedtime.      [provider]  memantine (NAMENDA) 10 MG tablet Take 1 tablet by mouth 2 (two) times daily. 04/26/17   [provider]  metFORMIN (GLUCOPHAGE) 500 MG tablet Take 500 mg by mouth 2 (two) times daily with a meal.    [provider]  pantoprazole (PROTONIX) 40 MG tablet Take 1 tablet by mouth daily. 05/11/17   [provider]  torsemide (DEMADEX) 20 MG tablet Take 20 mg by mouth daily.    [provider]  triamcinolone lotion (KENALOG) 0.1 % Apply 1 application topically 2 (two) times daily as needed. 05/03/17   [provider]    Allergies Penicillins  History reviewed. No pertinent family history.  Social History Social History   Tobacco Use  . Smoking status: Former Games developermoker  . Smokeless tobacco: Never Used  Substance Use Topics  . Alcohol use: No  . Drug use: No    Review of Systems Level 5 exemption history limited by the patient's dementia ____________________________________________   PHYSICAL EXAM:  VITAL SIGNS: ED Triage Vitals  Enc Vitals Group     BP      Pulse      Resp      Temp      Temp src      SpO2      Weight      Height      Head Circumference  Peak Flow      Pain Score      Pain Loc      Pain Edu?      Excl. in GC?     Constitutional: Pleasant cooperative in no acute distress Eyes: PERRL EOMI. Head: Atraumatic. Nose: No congestion/rhinnorhea. Mouth/Throat: No trismus Neck: No stridor.   Cardiovascular: Normal rate, regular rhythm. Grossly normal heart sounds.  Good peripheral circulation. Respiratory: Normal respiratory effort.  No retractions. Lungs CTAB and moving good air Gastrointestinal: Soft nontender No midline back tenderness somewhat tender right greater than left paraspinal lumbar Musculoskeletal: 2+ pitting edema bilateral lower extremities legs equal in size Neurologic:  Normal speech and language. No gross focal neurologic deficits are appreciated. Skin:  Skin is warm,  dry and intact. No rash noted. Psychiatric: Significant dementia   ____________________________________________   DIFFERENTIAL includes but not limited to  Cardiogenic syncope, vasovagal syncope, mechanical fall, metabolic arrangement, acute coronary syndrome, spinal fracture ____________________________________________   LABS (all labs ordered are listed, but only abnormal results are displayed)  Labs Reviewed  COMPREHENSIVE METABOLIC PANEL - Abnormal; Notable for the following components:      Result Value   Glucose, Bld 152 (*)    Creatinine, Ser 1.49 (*)    Calcium 8.8 (*)    Total Protein 6.4 (*)    Albumin 3.2 (*)    ALT 12 (*)    Alkaline Phosphatase 170 (*)    GFR calc non Af Amer 42 (*)    GFR calc Af Amer 49 (*)    All other components within normal limits  CBC WITH DIFFERENTIAL/PLATELET - Abnormal; Notable for the following components:   RBC 4.03 (*)    Hemoglobin 11.8 (*)    HCT 36.5 (*)    All other components within normal limits  TROPONIN I  URINALYSIS, COMPLETE (UACMP) WITH MICROSCOPIC    Lab work reviewed by me with no acute disease __________________________________________  EKG  ED ECG REPORT I, Merrily BrittleNeil Alin Chavira, the attending physician, personally viewed and interpreted this ECG.  Date: 06/21/2017 EKG Time:  Rate: 75 Rhythm: normal sinus rhythm QRS Axis: normal Intervals: normal ST/T Wave abnormalities: normal Narrative Interpretation: Low voltage and poor R wave progression.  EKG with extremely wavy baseline but does appear to be sinus with normal intervals no signs of acute ischemia.  ____________________________________________  RADIOLOGY  X-ray of the lumbar spine reviewed by me with chronic changes but no acute disease ____________________________________________   PROCEDURES  Procedure(s) performed: no  Procedures  Critical Care performed: no  Observation: no ____________________________________________   INITIAL  IMPRESSION / ASSESSMENT AND PLAN / ED COURSE  Pertinent labs & imaging results that were available during my care of the patient were reviewed by me and considered in my medical decision making (see chart for details).  The patient arrives in the emergency department hemodynamically stable and well-appearing.  X-ray shows chronic change in his back but no acute disease.  EKG with no concerning signs for cardiogenic syncope.  The patient was on monitor for several hours with no ectopy.  Had a lengthy discussion with the patient and family at bedside regarding the diagnosis and that I felt the patient was stable for outpatient management with follow-up with primary care physician.  The patient's family verbalized understanding and agreed with the plan.  He is discharged home in improved condition.      ____________________________________________   FINAL CLINICAL IMPRESSION(S) / ED DIAGNOSES  Final diagnoses:  Fall, initial encounter  Acute bilateral  low back pain without sciatica      NEW MEDICATIONS STARTED DURING THIS VISIT:  This SmartLink is deprecated. Use AVSMEDLIST instead to display the medication list for a patient.   Note:  This document was prepared using Dragon voice recognition software and may include unintentional dictation errors.     Merrily Brittle, MD 06/21/17 2350

## 2017-07-27 ENCOUNTER — Other Ambulatory Visit: Payer: Self-pay

## 2017-07-27 ENCOUNTER — Emergency Department
Admission: EM | Admit: 2017-07-27 | Discharge: 2017-07-27 | Disposition: A | Discharge status: Home / Self Care | Payer: Medicare Other | Attending: Emergency Medicine | Admitting: Emergency Medicine

## 2017-07-27 ENCOUNTER — Emergency Department: Payer: Medicare Other

## 2017-07-27 DIAGNOSIS — I739 Peripheral vascular disease, unspecified: Secondary | ICD-10-CM | POA: Diagnosis not present

## 2017-07-27 DIAGNOSIS — R008 Other abnormalities of heart beat: Secondary | ICD-10-CM | POA: Diagnosis not present

## 2017-07-27 DIAGNOSIS — Z794 Long term (current) use of insulin: Secondary | ICD-10-CM | POA: Insufficient documentation

## 2017-07-27 DIAGNOSIS — R0602 Shortness of breath: Secondary | ICD-10-CM | POA: Insufficient documentation

## 2017-07-27 DIAGNOSIS — I252 Old myocardial infarction: Secondary | ICD-10-CM | POA: Diagnosis not present

## 2017-07-27 DIAGNOSIS — Z7982 Long term (current) use of aspirin: Secondary | ICD-10-CM | POA: Diagnosis not present

## 2017-07-27 DIAGNOSIS — R2241 Localized swelling, mass and lump, right lower limb: Secondary | ICD-10-CM | POA: Diagnosis not present

## 2017-07-27 DIAGNOSIS — G309 Alzheimer's disease, unspecified: Secondary | ICD-10-CM | POA: Insufficient documentation

## 2017-07-27 DIAGNOSIS — Z9861 Coronary angioplasty status: Secondary | ICD-10-CM | POA: Diagnosis not present

## 2017-07-27 DIAGNOSIS — R2242 Localized swelling, mass and lump, left lower limb: Secondary | ICD-10-CM | POA: Diagnosis present

## 2017-07-27 DIAGNOSIS — I10 Essential (primary) hypertension: Secondary | ICD-10-CM | POA: Diagnosis not present

## 2017-07-27 DIAGNOSIS — R079 Chest pain, unspecified: Secondary | ICD-10-CM | POA: Diagnosis not present

## 2017-07-27 DIAGNOSIS — E119 Type 2 diabetes mellitus without complications: Secondary | ICD-10-CM | POA: Diagnosis not present

## 2017-07-27 LAB — CBC
HCT: 31.4 % — ABNORMAL LOW (ref 40.0–52.0)
Hemoglobin: 10.1 g/dL — ABNORMAL LOW (ref 13.0–18.0)
MCH: 28.7 pg (ref 26.0–34.0)
MCHC: 32.3 g/dL (ref 32.0–36.0)
MCV: 88.8 fL (ref 80.0–100.0)
PLATELETS: 198 10*3/uL (ref 150–440)
RBC: 3.54 MIL/uL — ABNORMAL LOW (ref 4.40–5.90)
RDW: 14.9 % — ABNORMAL HIGH (ref 11.5–14.5)
WBC: 5.7 10*3/uL (ref 3.8–10.6)

## 2017-07-27 LAB — BASIC METABOLIC PANEL
Anion gap: 5 (ref 5–15)
BUN: 19 mg/dL (ref 6–20)
CO2: 27 mmol/L (ref 22–32)
CREATININE: 1.38 mg/dL — AB (ref 0.61–1.24)
Calcium: 8.3 mg/dL — ABNORMAL LOW (ref 8.9–10.3)
Chloride: 108 mmol/L (ref 101–111)
GFR calc Af Amer: 53 mL/min — ABNORMAL LOW (ref >60)
GFR, EST NON AFRICAN AMERICAN: 46 mL/min — AB (ref >60)
GLUCOSE: 106 mg/dL — AB (ref 65–99)
Potassium: 4.1 mmol/L (ref 3.5–5.1)
SODIUM: 140 mmol/L (ref 135–145)

## 2017-07-27 LAB — TROPONIN I: Troponin I: <0.03 ng/mL (ref <0.03)

## 2017-07-27 LAB — BRAIN NATRIURETIC PEPTIDE: B NATRIURETIC PEPTIDE 5: 371 pg/mL — AB (ref 0.0–100.0)

## 2017-07-27 NOTE — ED Notes (Signed)
Pt urinated. Linens changed and chucks placed under pt. Urine sent to lab just in case it's ordered.

## 2017-07-27 NOTE — ED Notes (Signed)
Pt given meal tray and water to drink, ok per Dr. Lamont Snowballifenbark.

## 2017-07-27 NOTE — ED Notes (Signed)
Pt using urinal.

## 2017-07-27 NOTE — ED Triage Notes (Signed)
Pt to ed via EMS with complaints of left sided chest pain with SOB. EMS gave one SL nitro with no relief. He has bilat swelling of his legs he is on a fluid pill for this.

## 2017-07-27 NOTE — ED Notes (Signed)
Pt states left CP and SOB. Denies CHF or COPD. States he does take a fluid pill. States legs have become more swollen past few days. Alert, oriented.

## 2017-07-27 NOTE — ED Provider Notes (Signed)
[facility] Emergency Department Provider Note  ____________________________________________   First MD Initiated Contact with Patient 07/27/17 1551     (approximate)  I have reviewed the triage vital signs and the nursing notes.   HISTORY  Chief Complaint Chest Pain; Shortness of Breath; and Leg Swelling  Level 5 exemption history limited by the patient's dementia  HPI Shaun Davis is a 84 y.o. male is brought to the emergency department by EMS for bilateral lower extremity swelling for the past several weeks along with some nonspecific chest pain that began today.  The patient's pain is sharp in his left lateral chest.  Nothing seems to make it better or worse.  He has significant Alzheimer's and further history is challenging to obtain.  Past Medical History:  Diagnosis Date  . Dementia   . Diabetes mellitus without complication (HCC)   . History of MI (myocardial infarction)   . Hyperlipidemia   . Hypertension   . PVD (peripheral vascular disease) [facility])     Patient Active Problem List   Diagnosis Date Noted  . Syncope 05/23/2017    Past Surgical History:  Procedure Laterality Date  . CORONARY ANGIOPLASTY    . HERNIA REPAIR      Prior to Admission medications   Medication Sig Start Date End Date Taking? Authorizing Provider  acetaminophen (TYLENOL) 650 MG CR tablet Take 650 mg by mouth every 8 (eight) hours as needed for pain.    [provider]  aspirin 325 MG tablet Take 325 mg by mouth daily.    [provider]  atorvastatin (LIPITOR) 40 MG tablet Take 40 mg by mouth daily.    [provider]  carvedilol (COREG) 3.125 MG tablet Take 1 tablet (3.125 mg total) by mouth 2 (two) times daily with a meal. 05/25/17   Sudini, Srikar, MD  donepezil (ARICEPT) 10 MG tablet Take 10 mg by mouth at bedtime as needed.    [provider]  insulin aspart (NOVOLOG FLEXPEN) 100 UNIT/ML injection Inject 4 Units into the  skin 2 (two) times daily.     [provider]  Insulin Detemir (LEVEMIR) 100 UNIT/ML Pen Inject 20 Units into the skin at bedtime.     [provider]  memantine (NAMENDA) 10 MG tablet Take 1 tablet by mouth 2 (two) times daily. 04/26/17   [provider]  metFORMIN (GLUCOPHAGE) 500 MG tablet Take 500 mg by mouth 2 (two) times daily with a meal.    [provider]  pantoprazole (PROTONIX) 40 MG tablet Take 1 tablet by mouth daily. 05/11/17   [provider]  torsemide (DEMADEX) 20 MG tablet Take 20 mg by mouth daily.    [provider]  triamcinolone lotion (KENALOG) 0.1 % Apply 1 application topically 2 (two) times daily as needed. 05/03/17   [provider]    Allergies Penicillins  No family history on file.  Social History Social History   Tobacco Use  . Smoking status: Former Games developer  . Smokeless tobacco: Never Used  Substance Use Topics  . Alcohol use: No  . Drug use: No    Review of Systems Level 5 exemption history limited by the patient's dementia ____________________________________________   PHYSICAL EXAM:  VITAL SIGNS: ED Triage Vitals  Enc Vitals Group     BP 07/27/17 1546 (!) 118/52     Pulse Rate 07/27/17 1546 74     Resp 07/27/17 1546 18     Temp 07/27/17 1546 97.9  F (36.6 C)     Temp Source 07/27/17 1546 Oral     SpO2 07/27/17 1546 99 %     Weight 07/27/17 1550 250 lb (113.4 kg)     Height 07/27/17 1550 5\' 7"  (1.702 m)     Head Circumference --      Peak Flow --      Pain Score 07/27/17 1548 5     Pain Loc --      Pain Edu? --      Excl. in GC? --     Constitutional: Significant dementia in no acute distress Eyes: PERRL EOMI. Head: Atraumatic. Nose: No congestion/rhinnorhea. Mouth/Throat: No trismus Neck: No stridor.  Able to lie completely flat no JVD Cardiovascular: Normal rate, regular rhythm. Grossly normal heart sounds.  Good peripheral circulation. Respiratory: Normal  respiratory effort.  No retractions. Lungs CTAB and moving good air Gastrointestinal: Obese soft nontender Musculoskeletal: Legs are equal in size 2+ edema to the knees bilaterally Neurologic:  Normal speech and language. No gross focal neurologic deficits are appreciated. Skin:  Skin is warm, dry and intact. No rash noted. Psychiatric: Mood and affect are normal. Speech and behavior are normal.    ____________________________________________   DIFFERENTIAL includes but not limited to  Acute coronary syndrome, pulmonary embolism, aortic dissection, CHF, fluid overload ____________________________________________   LABS (all labs ordered are listed, but only abnormal results are displayed)  Labs Reviewed  BASIC METABOLIC PANEL - Abnormal; Notable for the following components:      Result Value   Glucose, Bld 106 (*)    Creatinine, Ser 1.38 (*)    Calcium 8.3 (*)    GFR calc non Af Amer 46 (*)    GFR calc Af Amer 53 (*)    All other components within normal limits  CBC - Abnormal; Notable for the following components:   RBC 3.54 (*)    Hemoglobin 10.1 (*)    HCT 31.4 (*)    RDW 14.9 (*)    All other components within normal limits  BRAIN NATRIURETIC PEPTIDE - Abnormal; Notable for the following components:   B Natriuretic Peptide 371.0 (*)    All other components within normal limits  TROPONIN I  TROPONIN I    Lab work reviewed by me with no signs of acute ischemia x2 __________________________________________  EKG  ED ECG REPORT I, Merrily Brittle, the attending physician, personally viewed and interpreted this ECG.  Date: 07/27/2017 EKG Time:  Rate: 88 Rhythm: normal sinus rhythm with bigeminy QRS Axis: normal Intervals: normal ST/T Wave abnormalities: normal Narrative Interpretation: no evidence of acute ischemia  ____________________________________________  RADIOLOGY  Chest x-ray reviewed by me with no acute  disease ____________________________________________   PROCEDURES  Procedure(s) performed: no  Procedures  Critical Care performed: no  Observation: no ____________________________________________   INITIAL IMPRESSION / ASSESSMENT AND PLAN / ED COURSE  Pertinent labs & imaging results that were available during my care of the patient were reviewed by me and considered in my medical decision making (see chart for details).  History is challenging to obtain from this patient.  His chest pain sounds atypical and nonexertional.  EKG is nonischemic and 2 troponins are negative.  He is clinically not significantly fluid overloaded.  He does take Lasix already.  I have advised him to follow-uphim and his wife to follow-up with cardiology within 2 days for reevaluation.  His wife verbalized understanding and agreement with the plan.      ____________________________________________   FINAL  CLINICAL IMPRESSION(S) / ED DIAGNOSES  Final diagnoses:  Chest pain, unspecified type      NEW MEDICATIONS STARTED DURING THIS VISIT:  Discharge Medication List as of 07/27/2017  7:49 PM       Note:  This document was prepared using Dragon voice recognition software and may include unintentional dictation errors.     Merrily Brittleifenbark, Keajah Killough, MD 07/28/17 2205

## 2017-07-27 NOTE — Discharge Instructions (Signed)
Fortunately today your blood work your chest x-ray into your EKG were reassuring.  Please make an appointment to establish care with cardiology within the next 2 days for reevaluation.  Return to the emergency department sooner for any concerns whatsoever.  It was a pleasure to take care of you today, and thank you for coming to our emergency department.  If you have any questions or concerns before leaving please ask the nurse to grab me and I'm more than happy to go through your aftercare instructions again.  If you were prescribed any opioid pain medication today such as Norco, Vicodin, Percocet, morphine, hydrocodone, or oxycodone please make sure you do not drive when you are taking this medication as it can alter your ability to drive safely.  If you have any concerns once you are home that you are not improving or are in fact getting worse before you can make it to your follow-up appointment, please do not hesitate to call 911 and come back for further evaluation.  Merrily BrittleNeil Julia Alkhatib, MD  Results for orders placed or performed during the hospital encounter of 07/27/17  Basic metabolic panel  Result Value Ref Range   Sodium 140 135 - 145 mmol/L   Potassium 4.1 3.5 - 5.1 mmol/L   Chloride 108 101 - 111 mmol/L   CO2 27 22 - 32 mmol/L   Glucose, Bld 106 (H) 65 - 99 mg/dL   BUN 19 6 - 20 mg/dL   Creatinine, Ser 1.191.38 (H) 0.61 - 1.24 mg/dL   Calcium 8.3 (L) 8.9 - 10.3 mg/dL   GFR calc non Af Amer 46 (L) >60 mL/min   GFR calc Af Amer 53 (L) >60 mL/min   Anion gap 5 5 - 15  CBC  Result Value Ref Range   WBC 5.7 3.8 - 10.6 K/uL   RBC 3.54 (L) 4.40 - 5.90 MIL/uL   Hemoglobin 10.1 (L) 13.0 - 18.0 g/dL   HCT 14.731.4 (L) 82.940.0 - 56.252.0 %   MCV 88.8 80.0 - 100.0 fL   MCH 28.7 26.0 - 34.0 pg   MCHC 32.3 32.0 - 36.0 g/dL   RDW 13.014.9 (H) 86.511.5 - 78.414.5 %   Platelets 198 150 - 440 K/uL  Troponin I  Result Value Ref Range   Troponin I <0.03 <0.03 ng/mL  Brain natriuretic peptide  Result Value Ref Range     B Natriuretic Peptide 371.0 (H) 0.0 - 100.0 pg/mL  Troponin I  Result Value Ref Range   Troponin I <0.03 <0.03 ng/mL   Dg Chest 2 View  Result Date: 07/27/2017 CLINICAL DATA:  Left chest pain and shortness of breath for 1 day. EXAM: CHEST  2 VIEW COMPARISON:  05/23/2017 and prior radiographs FINDINGS: Mild cardiomegaly noted. Mild peribronchial thickening and minimal bibasilar scarring again noted. There is no evidence of focal airspace disease, pulmonary edema, suspicious pulmonary nodule/mass, pleural effusion, or pneumothorax. No acute bony abnormalities are identified. IMPRESSION: Mild cardiomegaly without acute cardiopulmonary disease. Electronically Signed   By: Harmon PierJeffrey  Hu M.D.   On: 07/27/2017 17:21

## 2017-07-27 NOTE — ED Notes (Signed)
Pt in xray

## 2018-03-27 IMAGING — US US EXTREM LOW VENOUS BILAT
1 series · 13 of 24 positions shown · non-contrast
Comparison: None.

CLINICAL DATA: 84-year-old male with a history of swelling



[Series 1: us extrem low venous bilat · 0.08mm/px · 13 of 59 slices shown]
[im 1/59]
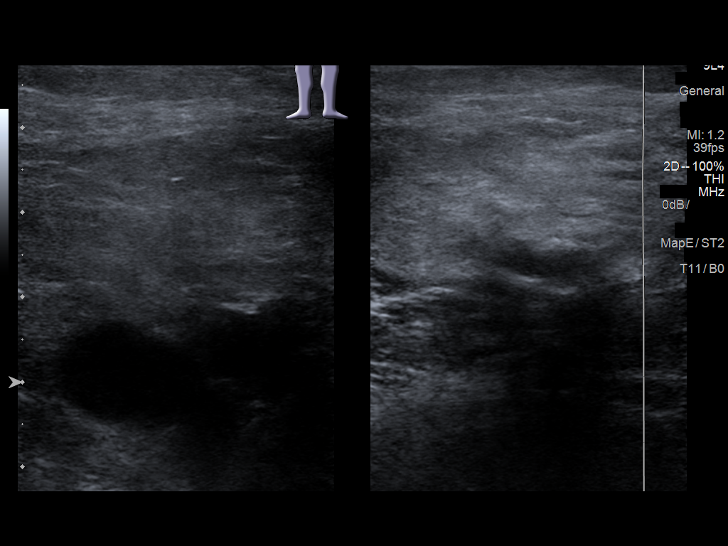
[im 6/59]
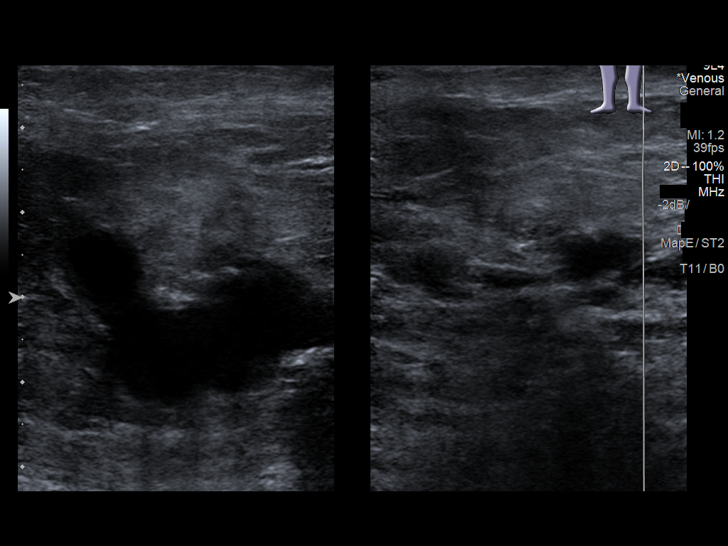
[im 11/59]
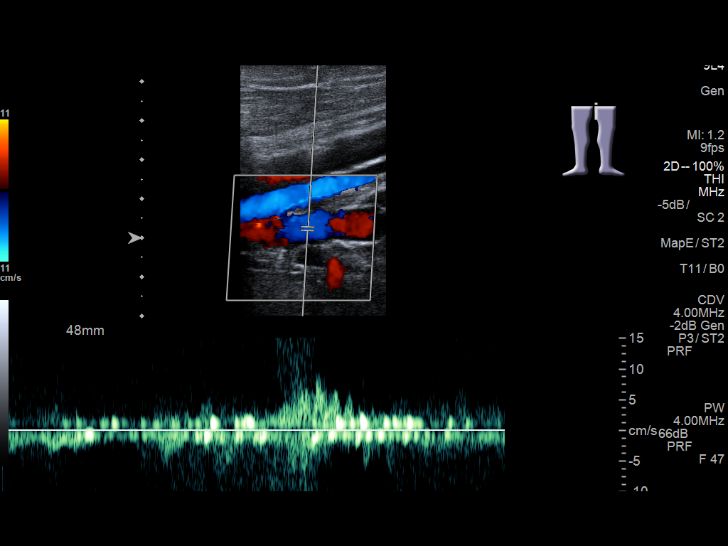
[im 16/59]
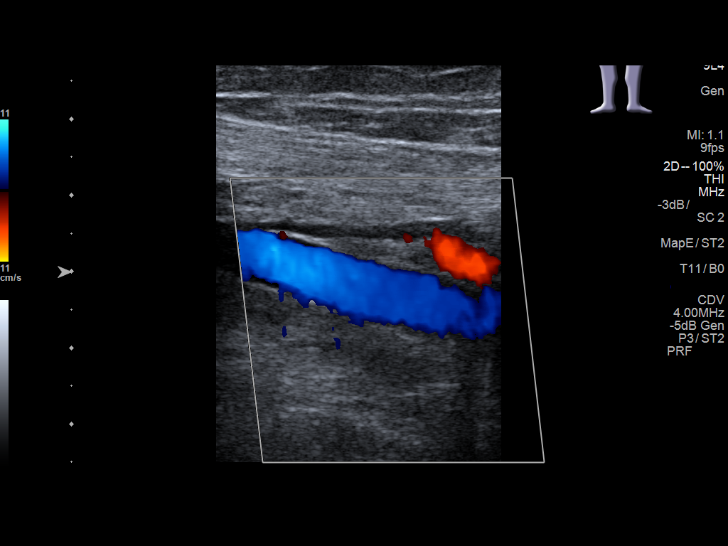
[im 21/59]
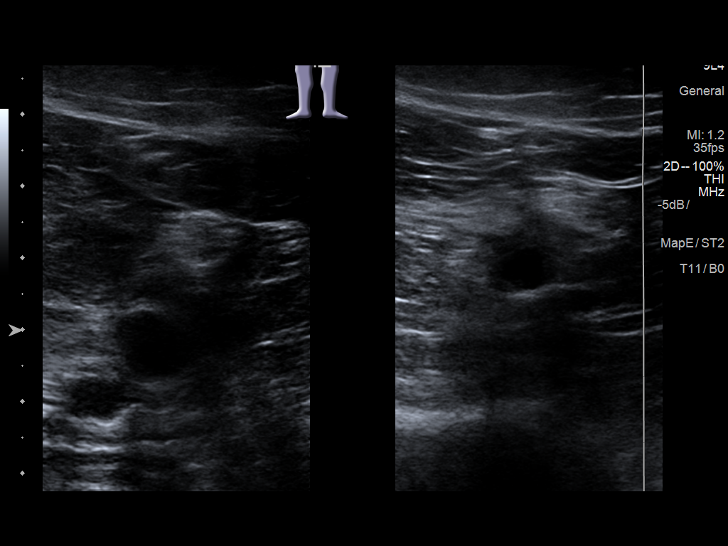
[im 26/59]
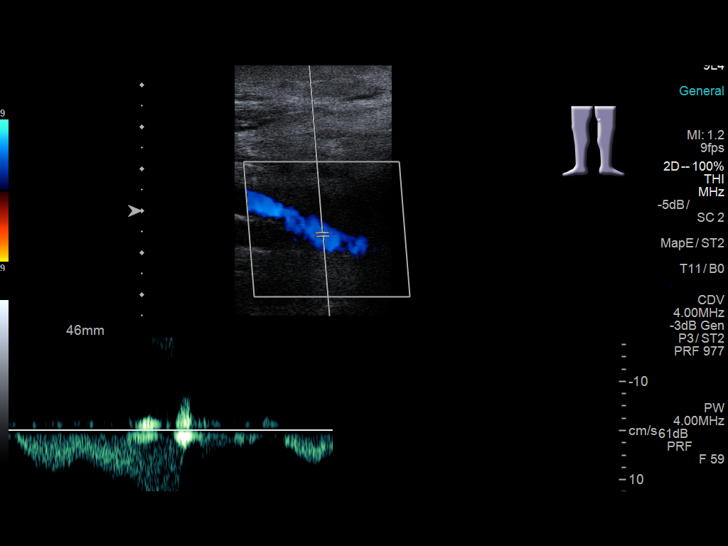
[im 31/59]
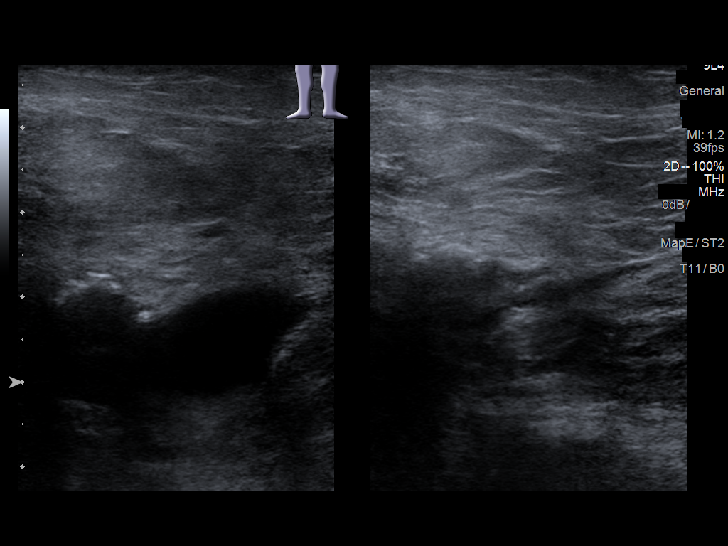
[im 33/59]
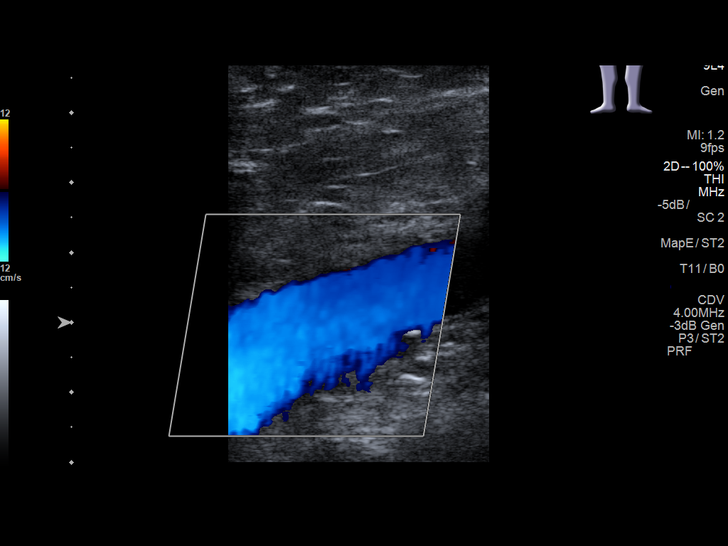
[im 38/59]
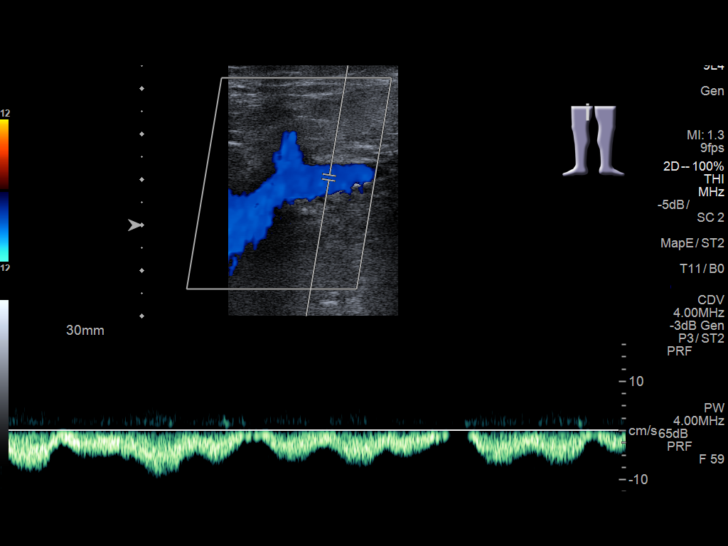
[im 43/59]
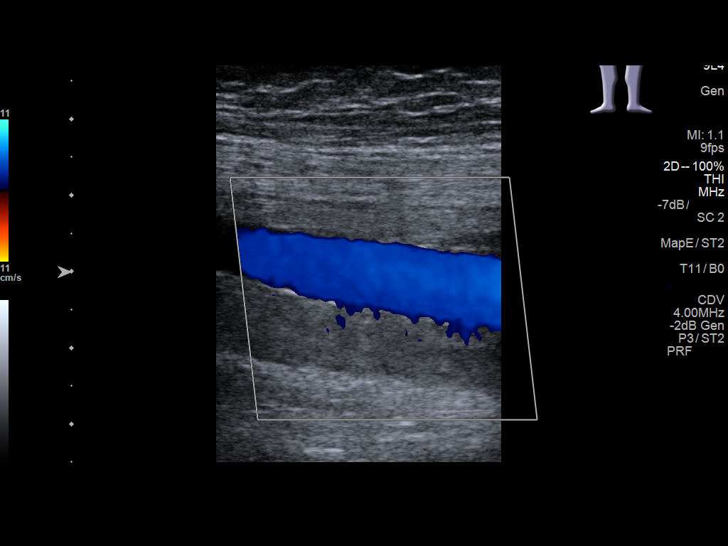
[im 48/59]
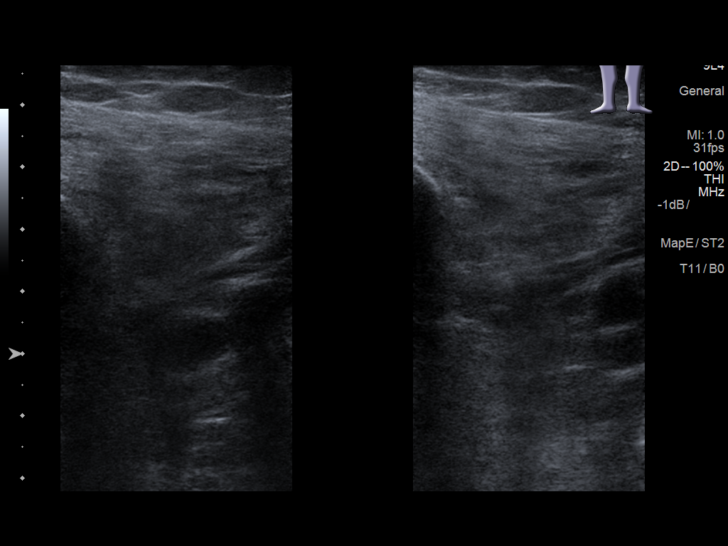
[im 53/59]
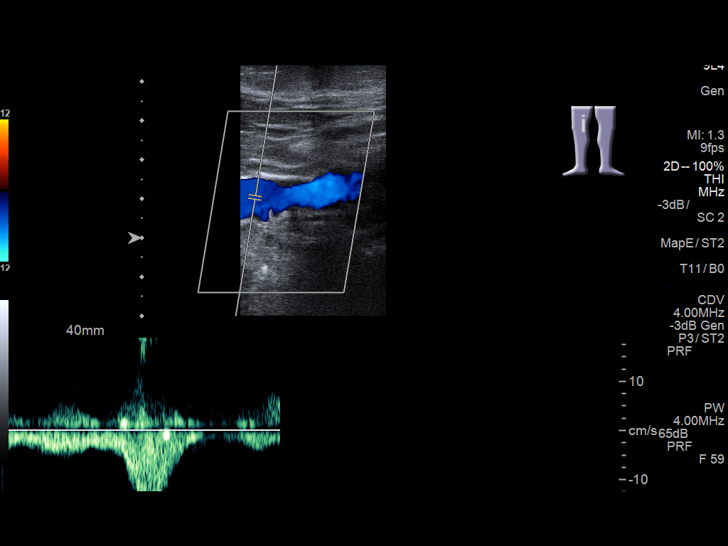
[im 59/59]
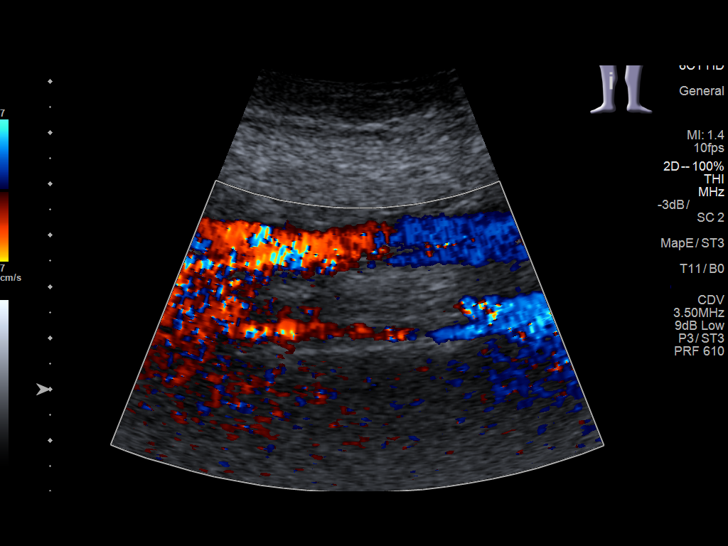

[13 of 24 positions shown; findings below may reference images not displayed]

FINDINGS: RIGHT LOWER EXTREMITY

Common Femoral Vein: No evidence of thrombus. Normal
compressibility, respiratory phasicity and response to augmentation.

Saphenofemoral Junction: No evidence of thrombus. Normal
compressibility and flow on color Doppler imaging.

Profunda Femoral Vein: No evidence of thrombus. Normal
compressibility and flow on color Doppler imaging.

Femoral Vein: No evidence of thrombus. Normal compressibility,
respiratory phasicity and response to augmentation.

Popliteal Vein: No evidence of thrombus. Normal compressibility,
respiratory phasicity and response to augmentation.

Calf Veins: No evidence of thrombus. Normal compressibility and flow
on color Doppler imaging.

Superficial Great Saphenous Vein: No evidence of thrombus. Normal
compressibility and flow on color Doppler imaging.

Other Findings:  None.

LEFT LOWER EXTREMITY

Common Femoral Vein: No evidence of thrombus. Normal
compressibility, respiratory phasicity and response to augmentation.

Saphenofemoral Junction: No evidence of thrombus. Normal
compressibility and flow on color Doppler imaging.

Profunda Femoral Vein: No evidence of thrombus. Normal
compressibility and flow on color Doppler imaging.

Femoral Vein: No evidence of thrombus. Normal compressibility,
respiratory phasicity and response to augmentation.

Popliteal Vein: No evidence of thrombus. Normal compressibility,
respiratory phasicity and response to augmentation.

Calf Veins: No evidence of thrombus. Normal compressibility and flow
on color Doppler imaging.

Superficial Great Saphenous Vein: No evidence of thrombus. Normal
compressibility and flow on color Doppler imaging.

Other Findings:  None.
IMPRESSION: Sonographic survey bilateral lower extremities negative for DVT

## 2018-06-20 ENCOUNTER — Other Ambulatory Visit: Payer: Self-pay

## 2018-06-20 ENCOUNTER — Emergency Department
Admission: EM | Admit: 2018-06-20 | Discharge: 2018-06-21 | Disposition: A | Discharge status: Home / Self Care | Payer: Medicare Other | Attending: Emergency Medicine | Admitting: Emergency Medicine

## 2018-06-20 DIAGNOSIS — K921 Melena: Secondary | ICD-10-CM

## 2018-06-20 DIAGNOSIS — I10 Essential (primary) hypertension: Secondary | ICD-10-CM | POA: Diagnosis not present

## 2018-06-20 DIAGNOSIS — E785 Hyperlipidemia, unspecified: Secondary | ICD-10-CM | POA: Insufficient documentation

## 2018-06-20 DIAGNOSIS — Z79899 Other long term (current) drug therapy: Secondary | ICD-10-CM | POA: Insufficient documentation

## 2018-06-20 DIAGNOSIS — F0390 Unspecified dementia without behavioral disturbance: Secondary | ICD-10-CM | POA: Insufficient documentation

## 2018-06-20 DIAGNOSIS — E119 Type 2 diabetes mellitus without complications: Secondary | ICD-10-CM | POA: Diagnosis not present

## 2018-06-20 LAB — COMPREHENSIVE METABOLIC PANEL
ALT: 10 U/L (ref 0–44)
ANION GAP: 8 (ref 5–15)
AST: 15 U/L (ref 15–41)
Albumin: 3.9 g/dL (ref 3.5–5.0)
Alkaline Phosphatase: 157 U/L — ABNORMAL HIGH (ref 38–126)
BUN: 24 mg/dL — ABNORMAL HIGH (ref 8–23)
CALCIUM: 8.8 mg/dL — AB (ref 8.9–10.3)
CO2: 26 mmol/L (ref 22–32)
CREATININE: 1.55 mg/dL — AB (ref 0.61–1.24)
Chloride: 104 mmol/L (ref 98–111)
GFR calc non Af Amer: 41 mL/min — ABNORMAL LOW (ref >60)
GFR, EST AFRICAN AMERICAN: 47 mL/min — AB (ref >60)
Glucose, Bld: 243 mg/dL — ABNORMAL HIGH (ref 70–99)
Potassium: 4.2 mmol/L (ref 3.5–5.1)
Sodium: 138 mmol/L (ref 135–145)
Total Bilirubin: 0.5 mg/dL (ref 0.3–1.2)
Total Protein: 6.5 g/dL (ref 6.5–8.1)

## 2018-06-20 LAB — CBC
HCT: 38 % — ABNORMAL LOW (ref 39.0–52.0)
Hemoglobin: 11.5 g/dL — ABNORMAL LOW (ref 13.0–17.0)
MCH: 27.1 pg (ref 26.0–34.0)
MCHC: 30.3 g/dL (ref 30.0–36.0)
MCV: 89.4 fL (ref 80.0–100.0)
Platelets: 145 10*3/uL — ABNORMAL LOW (ref 150–400)
RBC: 4.25 MIL/uL (ref 4.22–5.81)
RDW: 15.3 % (ref 11.5–15.5)
WBC: 6.6 10*3/uL (ref 4.0–10.5)
nRBC: 0 % (ref 0.0–0.2)

## 2018-06-20 LAB — TYPE AND SCREEN
ABO/RH(D): A POS
Antibody Screen: NEGATIVE

## 2018-06-20 NOTE — ED Triage Notes (Signed)
Bright red rectal bleeding intermittently over last 2 weeks. 2 episodes of rectal bleeding today. Generalized weakness. Pt takes ASA. AOX3, pale.

## 2018-06-20 NOTE — ED Provider Notes (Signed)
[facility] Emergency Department Provider Note  ____________________________________________   First MD Initiated Contact with Patient 06/20/18 2314     (approximate)  I have reviewed the triage vital signs and the nursing notes.   HISTORY  Chief Complaint Weakness and GI Bleeding  Level 5 exemption history limited by the patient's dementia  HPI Shaun Davis is a 84 y.o. male is brought to the emergency department with intermittent episodes of hematochezia for the past 2 weeks.  History obtained exclusively from the patient's wife as the patient himself has profound dementia.  He gets all of his health care from doctors making house calls as according to the wife the patient has not wanted to leave his home for many years and has not followed up with any physicians in "a long time".  For the past 2 weeks he has had nearly daily episodes of bright red blood per rectum.  Dr. made a house call recently and diagnosed him with external hemorrhoids and prescribed a cream but the patient has not been willing to take.  The patient had another bowel movement today with bright red blood which prompted the visit.  He has never had a colonoscopy nor an endoscopy.  The patient himself has no complaints.    Past Medical History:  Diagnosis Date  . Dementia (HCC)   . Diabetes mellitus without complication (HCC)   . History of MI (myocardial infarction)   . Hyperlipidemia   . Hypertension   . PVD (peripheral vascular disease) [name])     Patient Active Problem List   Diagnosis Date Noted  . Syncope 05/23/2017    Past Surgical History:  Procedure Laterality Date  . CORONARY ANGIOPLASTY    . HERNIA REPAIR      Prior to Admission medications   Medication Sig Start Date End Date Taking? Authorizing Provider  acetaminophen (TYLENOL) 650 MG CR tablet Take 650 mg by mouth every 8 (eight) hours as needed for pain.    [provider]  aspirin 325 MG tablet Take  325 mg by mouth daily.    [provider]  atorvastatin (LIPITOR) 40 MG tablet Take 40 mg by mouth daily.    [provider]  carvedilol (COREG) 3.125 MG tablet Take 1 tablet (3.125 mg total) by mouth 2 (two) times daily with a meal. 05/25/17   Sudini, Srikar, MD  donepezil (ARICEPT) 10 MG tablet Take 10 mg by mouth at bedtime as needed.    [provider]  insulin aspart (NOVOLOG FLEXPEN) 100 UNIT/ML injection Inject 4 Units into the skin 2 (two) times daily.     [provider]  Insulin Detemir (LEVEMIR) 100 UNIT/ML Pen Inject 20 Units into the skin at bedtime.     [provider]  memantine (NAMENDA) 10 MG tablet Take 1 tablet by mouth 2 (two) times daily. 04/26/17   [provider]  metFORMIN (GLUCOPHAGE) 500 MG tablet Take 500 mg by mouth 2 (two) times daily with a meal.    [provider]  pantoprazole (PROTONIX) 40 MG tablet Take 1 tablet by mouth daily. 05/11/17   [provider]  torsemide (DEMADEX) 20 MG tablet Take 20 mg by mouth daily.    [provider]  triamcinolone lotion (KENALOG) 0.1 % Apply 1 application topically 2 (two) times daily as needed. 05/03/17   [provider]    Allergies Penicillins  No family history on file.  Social History Social History   Tobacco Use  .  Smoking status: Former Games developermoker  . Smokeless tobacco: Never Used  Substance Use Topics  . Alcohol use: No  . Drug use: No    Review of Systems Level 5 exemption history limited by the patient's dementia ____________________________________________   PHYSICAL EXAM:  VITAL SIGNS: ED Triage Vitals  Enc Vitals Group     BP 06/20/18 1636 105/62     Pulse Rate 06/20/18 1636 82     Resp 06/20/18 1636 18     Temp 06/20/18 1636 98.9 F (37.2 C)     Temp src --      SpO2 06/20/18 1636 95 %     Weight --      Height 06/20/18 1644 5\' 5"  (1.651 m)     Head Circumference --      Peak Flow --      Pain Score  06/20/18 1643 7     Pain Loc --      Pain Edu? --      Excl. in GC? --     Constitutional: Pleasant and cooperative although profound dementia and alert and oriented x1 to his name only Eyes: PERRL EOMI. midrange and brisk Head: Atraumatic. Nose: No congestion/rhinnorhea. Mouth/Throat: No trismus Neck: No stridor.   Cardiovascular: Normal rate, regular rhythm. Grossly normal heart sounds.  Good peripheral circulation. Respiratory: Normal respiratory effort.  No retractions. Lungs CTAB and moving good air Gastrointestinal: Soft nontender.  Guaiac positive control positive brown stool with external hemorrhoids visualized that do not appear to have recently bled Musculoskeletal: No lower extremity edema   Neurologic:  Normal speech and language. No gross focal neurologic deficits are appreciated. Skin:  Skin is warm, dry and intact. No rash noted. Psychiatric: Severe dementia   ____________________________________________   DIFFERENTIAL includes but not limited to  Colon cancer, internal hemorrhoid, anal fissure, diverticulitis ____________________________________________   LABS (all labs ordered are listed, but only abnormal results are displayed)  Labs Reviewed  COMPREHENSIVE METABOLIC PANEL - Abnormal; Notable for the following components:      Result Value   Glucose, Bld 243 (*)    BUN 24 (*)    Creatinine, Ser 1.55 (*)    Calcium 8.8 (*)    Alkaline Phosphatase 157 (*)    GFR calc non Af Amer 41 (*)    GFR calc Af Amer 47 (*)    All other components within normal limits  CBC - Abnormal; Notable for the following components:   Hemoglobin 11.5 (*)    HCT 38.0 (*)    Platelets 145 (*)    All other components within normal limits  TYPE AND SCREEN    Lab work reviewed by me shows stable hemoglobin and otherwise largely unremarkable.  Elevated alkaline phosphatase is concerning for bony metastasis __________________________________________  EKG  ED ECG REPORT I,  Merrily BrittleNeil Tejas Seawood, the attending physician, personally viewed and interpreted this ECG.  Date: 06/23/2018 EKG Time:  Rate: 82 Rhythm: Normal sinus rhythm with bigeminy QRS Axis: Leftward axis Intervals: normal ST/T Wave abnormalities: normal Narrative Interpretation: no evidence of acute ischemia  ____________________________________________  RADIOLOGY   ____________________________________________   PROCEDURES  Procedure(s) performed: no  Procedures  Critical Care performed: no  ____________________________________________   INITIAL IMPRESSION / ASSESSMENT AND PLAN / ED COURSE  Pertinent labs & imaging results that were available during my care of the patient were reviewed by me and considered in my medical decision making (see chart for details).   As part of my medical decision making, I reviewed  the following data within the electronic MEDICAL RECORD NUMBER History obtained from family if available, nursing notes, old chart and ekg, as well as notes from prior ED visits.       ----------------------------------------- 12:46 AM on 06/21/2018 -----------------------------------------  On exam the patient has external hemorrhoids but they do not appear to have recently bled.  I had a lengthy discussion with the patient and his wife regarding goals of care and the purpose of an inpatient admission would be for colonoscopy.  If the colonoscopy found malignancy we discussed whether or not they would want treatment.  I did offer inpatient admission for this colonoscopy however the patient's wife declined stating they would prefer to go home.  He is hemodynamically stable and does not require blood transfusion at this point.  Strict return precautions have been given and the patient's wife feels welcome to return at any time for further evaluation and treatment. ____________________________________________   FINAL CLINICAL IMPRESSION(S) / ED DIAGNOSES  Final diagnoses:    Hematochezia      NEW MEDICATIONS STARTED DURING THIS VISIT:  Discharge Medication List as of 06/21/2018 12:45 AM       Note:  This document was prepared using Dragon voice recognition software and may include unintentional dictation errors.    Merrily Brittleifenbark, Alexus Michael, MD 06/23/18 2233

## 2018-06-21 NOTE — Discharge Instructions (Addendum)
Today I offered to keep your husband in the hospital for a colonoscopy to figure out why he has been having so much bleeding however you declined thinking he would probably not want a colonoscopy.  Please know that we are more than happy to see you at any point in the future and are happy to continue your husband's care should you change your mind.  Please return to the emergency department for any issues whatsoever and follow-up with doctors making house calls for recheck this coming Monday as scheduled.  It was a pleasure to take care of you today, and thank you for coming to our emergency department.  If you have any questions or concerns before leaving please ask the nurse to grab me and I'm more than happy to go through your aftercare instructions again.  If you were prescribed any opioid pain medication today such as Norco, Vicodin, Percocet, morphine, hydrocodone, or oxycodone please make sure you do not drive when you are taking this medication as it can alter your ability to drive safely.  If you have any concerns once you are home that you are not improving or are in fact getting worse before you can make it to your follow-up appointment, please do not hesitate to call 911 and come back for further evaluation.  Merrily BrittleNeil Leshae Mcclay, MD  Results for orders placed or performed during the hospital encounter of 06/20/18  Comprehensive metabolic panel  Result Value Ref Range   Sodium 138 135 - 145 mmol/L   Potassium 4.2 3.5 - 5.1 mmol/L   Chloride 104 98 - 111 mmol/L   CO2 26 22 - 32 mmol/L   Glucose, Bld 243 (H) 70 - 99 mg/dL   BUN 24 (H) 8 - 23 mg/dL   Creatinine, Ser 9.601.55 (H) 0.61 - 1.24 mg/dL   Calcium 8.8 (L) 8.9 - 10.3 mg/dL   Total Protein 6.5 6.5 - 8.1 g/dL   Albumin 3.9 3.5 - 5.0 g/dL   AST 15 15 - 41 U/L   ALT 10 0 - 44 U/L   Alkaline Phosphatase 157 (H) 38 - 126 U/L   Total Bilirubin 0.5 0.3 - 1.2 mg/dL   GFR calc non Af Amer 41 (L) >60 mL/min   GFR calc Af Amer 47 (L) >60  mL/min   Anion gap 8 5 - 15  CBC  Result Value Ref Range   WBC 6.6 4.0 - 10.5 K/uL   RBC 4.25 4.22 - 5.81 MIL/uL   Hemoglobin 11.5 (L) 13.0 - 17.0 g/dL   HCT 45.438.0 (L) 09.839.0 - 11.952.0 %   MCV 89.4 80.0 - 100.0 fL   MCH 27.1 26.0 - 34.0 pg   MCHC 30.3 30.0 - 36.0 g/dL   RDW 14.715.3 82.911.5 - 56.215.5 %   Platelets 145 (L) 150 - 400 K/uL   nRBC 0.0 0.0 - 0.2 %  Type and screen [facility]  Result Value Ref Range   ABO/RH(D) A POS    Antibody Screen NEG    Sample Expiration      06/23/2018 Performed at [facility], [address] [place], [place] [address]

## 2018-10-27 ENCOUNTER — Other Ambulatory Visit: Payer: Self-pay

## 2018-10-27 ENCOUNTER — Other Ambulatory Visit: Payer: Medicare Other | Admitting: Primary Care

## 2018-10-27 DIAGNOSIS — Z515 Encounter for palliative care: Secondary | ICD-10-CM

## 2018-10-27 NOTE — Progress Notes (Addendum)
[facility] Consult Note Telephone: [phone]  Fax: [phone]  TELEHEALTH VISIT STATEMENT Due to the COVID-19 crisis, this visit was done via telemedicine from my office. It was initiated and consented to by this patient and/or family.   PATIENT NAME: Shaun Davis DOB: 1933/01/09 MRN: [id number]  PRIMARY CARE PROVIDER:   [facility]  Lorayne BenderKatherine Jones, [place]  REFERRING PROVIDER:  [facility] [address] [place], [place] [address]  RESPONSIBLE PARTY:   Extended Emergency Contact Information Primary Emergency Contact: Derek [place],Mary  [place] Home Phone: [phone] Mobile Phone: [phone] Relation: Spouse Secondary Emergency Contact: Lavona MoundBrown, Winf[place] Mobile Phone: [phone] Relation: Son   Palliative Care was asked to follow patient by consultation request of Dr. [facility]  . This is the initial  visit.    ASSESSMENT and RECOMMENDATIONS:   1 Nutrition: Encourage caloric intake. Eating 25-50% food. Taking fluids regularly. PCG states he does not like ensure and he's a picky eater. He does like ice cream and she will offer this often. Patient does appear WNWD.   2. Dysphagia: Recommend continued feeding assistance. Consider ST assessment if problem persists.  PCG states trouble swallowing. She has been feeding him. States he complains mouth hurts but PCG states she cannot see anything abnormal. Had nystatin swish and swallow but has been refusing it so he no longer takes this. Does not strangle on thin liquids so would likely not benefit from a thickener at this time.   3. Constipation:  Recommend to increase senna if he remains constipated. H/o ulcers and hemorrhoids. Takes senna two tablets daily which  Seem to be working. Patient is poor historian and family does not observe stool habits.  4. Eye redness: Recommend OTC lubricant drops and/or allergy  drops.  Appears viral or allergic, mildly red likely from his rubbing, and no exudate.   5. Mobility: Recommend fall precautions. Can walk to bathroom and toilets alone with walker. PCG states he has been going less times. Wife denies observed falls. She states he reported a night time fall but he was not on the floor and apparently got up by himself. He had a life line button but never used it so they returned it. Has lift chair with button to elevate chair and gets up and down from that alone. Bathing is done by caregiver sponge bath but he does not go in to shower any more.   6. Sleeping: Continue to monitor for s/sx advancing dementia. Sometimes he sleeps all day, but then also sleeps at night. E.g. sleeping a lot not just reversing day and night. Wife states he is not agitated or upset, e.g. sundowning behavior. Does not watch TV during day and sleeps or just sits.  7. Goals of Care: Full Code. States they would return to hospital for care, will continue conversation RE advance directives and MOST status.  Palliative care will continue to follow for goals of care clarification and symptom management. Return 4-6 weeks or prn.  I spent 45 minutes providing this consultation,  from 1000 to 1045. More than 50% of the time in this consultation was spent coordinating communication.   HISTORY OF PRESENT ILLNESS:  Shaun Phoenixhomas F Bert is a 85 y.o. year old male with multiple medical problems including Alzheimer's dementia, DM, HTN, PVD, h/o MI. Palliative Care was asked to help address goals of care.   CODE STATUS: FULL PPS: 40% HOSPICE ELIGIBILITY/DIAGNOSIS: TBD  PAST MEDICAL HISTORY:  Past Medical History:  Diagnosis Date  . Dementia (HCC)   . Diabetes mellitus without complication (HCC)   . History of MI (myocardial infarction)   . Hyperlipidemia   . Hypertension   . PVD (peripheral vascular disease) (HCC)     SOCIAL HX:  Social History   Tobacco Use  . Smoking status: Former Games developer  .  Smokeless tobacco: Never Used  Substance Use Topics  . Alcohol use: No    ALLERGIES:  Allergies  Allergen Reactions  . Penicillins Rash    Has patient had a PCN reaction causing immediate rash, facial/tongue/throat swelling, SOB or lightheadedness with hypotension: Yes Has patient had a PCN reaction causing severe rash involving mucus membranes or skin necrosis: No Has patient had a PCN reaction that required hospitalization: No Has patient had a PCN reaction occurring within the last 10 years: No If all of the above answers are "NO", then may proceed with Cephalosporin use.     PERTINENT MEDICATIONS:  Outpatient Encounter Medications as of 10/27/2018  Medication Sig  . acetaminophen (TYLENOL) 650 MG CR tablet Take 650 mg by mouth every 8 (eight) hours as needed for pain.  Marland Kitchen aspirin 325 MG tablet Take 325 mg by mouth daily.  Marland Kitchen atorvastatin (LIPITOR) 40 MG tablet Take 40 mg by mouth daily.  . carvedilol (COREG) 3.125 MG tablet Take 1 tablet (3.125 mg total) by mouth 2 (two) times daily with a meal.  . donepezil (ARICEPT) 10 MG tablet Take 10 mg by mouth at bedtime as needed.  . insulin aspart (NOVOLOG FLEXPEN) 100 UNIT/ML injection Inject 4 Units into the skin 2 (two) times daily.   . Insulin Detemir (LEVEMIR) 100 UNIT/ML Pen Inject 20 Units into the skin at bedtime.   . memantine (NAMENDA) 10 MG tablet Take 1 tablet by mouth 2 (two) times daily.  . metFORMIN (GLUCOPHAGE) 500 MG tablet Take 500 mg by mouth 2 (two) times daily with a meal.  . pantoprazole (PROTONIX) 40 MG tablet Take 1 tablet by mouth daily.  Marland Kitchen torsemide (DEMADEX) 20 MG tablet Take 20 mg by mouth daily.  Marland Kitchen triamcinolone lotion (KENALOG) 0.1 % Apply 1 application topically 2 (two) times daily as needed.   No facility-administered encounter medications on file as of 10/27/2018.     PHYSICAL EXAM:   General: NAD, frail appearing, wt. 172, states baseline was around 200 lb. HEENT: Red left eye, watery but no exudate.  Cardiovascular: c/o epigastric pain occasionally Pulmonary:No SOB, no cough Abdomen: eating <50%, bms regular, no blood reported GU: no c/o dysuria Extremities: ambulates with walker. Skin: no rashes or wounds on gross exam Neurological: Weakness, talks to self but does not seem to be hallucinating, sleeps a lot per family report.  Marijo File DNP, AGPCNP-BC

## 2018-11-17 ENCOUNTER — Other Ambulatory Visit: Payer: Self-pay

## 2018-11-17 ENCOUNTER — Other Ambulatory Visit: Payer: Medicare Other | Admitting: Primary Care

## 2018-11-17 DIAGNOSIS — Z515 Encounter for palliative care: Secondary | ICD-10-CM

## 2018-11-17 NOTE — Progress Notes (Signed)
[facility] Consult Note Telephone: [phone]) [phone]  Fax: [phone]  TELEHEALTH VISIT STATEMENT Due to the COVID-19 crisis, this visit was done via telemedicine from my office. It was initiated and consented to by this patient and/or family.  PATIENT NAME: Shaun Davis Feeny DOB: 05-May-1933 MRN: [id number]  PRIMARY CARE PROVIDER:   [facility]  REFERRING PROVIDER:  [facility] [address] [place], [place] [address]  RESPONSIBLE PARTY:   Extended Emergency Contact Information Primary Emergency Contact: Derek [place],Mary  [place] Home Phone: [phone] Mobile Phone: [phone] Relation: Spouse Secondary Emergency Contact: Lavona MoundBrown, [place] Mobile Phone: [phone] Relation: Son   Palliative Care was asked to follow patient by consultation request of Dr.[facility]  .  This is a patient of Lorayne BenderKatherine Jones, [place]. This is a follow up visit.    ASSESSMENT and RECOMMENDATIONS:   1. Nutrition: Appetite poor to fair, eats 50% of meals, usually breakfast and potato chips. Encourage dietary supplements.He is drinking fluids, although frequently soft drinks. Must be fed to eat more than 25-50% diet.   2. Mobility: Can ambulate to bathroom and has not had reported falls. Continue to monitor for safety. Wife states he can go into kitchen and get chips and take them back to his chair. He sleeps in recliner in den.  3.  Constipation: Appears regular with daily miralax. Continue to monitor bowel program and needs.He toilets himself and does not report bm specifically to caregivers. Does not complain of belly pain. I asked them to monitor as well as they can to ascertain if there is constipation affecting intake, esp. In light of increasing pain med, see below.  4. Pain:  Recommend to give tramadol 50 mg bid for pain control x 1 week  then  recommend increasing to tid then  qid due to his  voicing extreme leg pain. C/o leg pain, likely neuropathy. Family and I feel gabapentin would make him too sleepy as he is already sleeping much of the time. Scheduling pain medication will help better pain management vs treating prn.  5. Goals of Care: Will continue to help family clarify goals of care in light of progressing chronic disease.   Palliative care will continue to follow for goals of care clarification and symptom management. Return 2 weeks or prn.  I spent 40 minutes providing this consultation,  from 1120 to 1200. More than 50% of the time in this consultation was spent coordinating communication.   HISTORY OF PRESENT ILLNESS:  Shaun Davis Valli is a 85 y.o. year old male with multiple medical problems including Alzheimer's dementia, DM, peripheral neuropathies, HTN, PVD, h/o MI.. Palliative Care was asked to help address goals of care.   CODE STATUS: FULL  PPS: 40% HOSPICE ELIGIBILITY/DIAGNOSIS: TBD  PAST MEDICAL HISTORY:  Past Medical History:  Diagnosis Date  . Dementia (HCC)   . Diabetes mellitus without complication (HCC)   . History of MI (myocardial infarction)   . Hyperlipidemia   . Hypertension   . PVD (peripheral vascular disease) (HCC)     SOCIAL HX:  Social History   Tobacco Use  . Smoking status: Former Games developermoker  . Smokeless tobacco: Never Used  Substance Use Topics  . Alcohol use: No    ALLERGIES:  Allergies  Allergen Reactions  . Penicillins Rash    Has patient had a PCN reaction causing immediate rash, facial/tongue/throat swelling, SOB or lightheadedness with hypotension: Yes Has patient had a PCN  reaction causing severe rash involving mucus membranes or skin necrosis: No Has patient had a PCN reaction that required hospitalization: No Has patient had a PCN reaction occurring within the last 10 years: No If all of the above answers are "NO", then may proceed with Cephalosporin use.     PERTINENT MEDICATIONS:  Outpatient Encounter Medications as  of 11/17/2018  Medication Sig  . acetaminophen (TYLENOL) 650 MG CR tablet Take 650 mg by mouth every 8 (eight) hours as needed for pain.  Marland Kitchen aspirin 325 MG tablet Take 325 mg by mouth daily.  Marland Kitchen atorvastatin (LIPITOR) 40 MG tablet Take 40 mg by mouth daily.  . carvedilol (COREG) 3.125 MG tablet Take 1 tablet (3.125 mg total) by mouth 2 (two) times daily with a meal.  . donepezil (ARICEPT) 10 MG tablet Take 10 mg by mouth at bedtime as needed.  . insulin aspart (NOVOLOG FLEXPEN) 100 UNIT/ML injection Inject 4 Units into the skin 2 (two) times daily.   . Insulin Detemir (LEVEMIR) 100 UNIT/ML Pen Inject 20 Units into the skin at bedtime.   . memantine (NAMENDA) 10 MG tablet Take 1 tablet by mouth 2 (two) times daily.  . metFORMIN (GLUCOPHAGE) 500 MG tablet Take 500 mg by mouth 2 (two) times daily with a meal.  . pantoprazole (PROTONIX) 40 MG tablet Take 1 tablet by mouth daily.  Marland Kitchen torsemide (DEMADEX) 20 MG tablet Take 20 mg by mouth daily.  Marland Kitchen triamcinolone lotion (KENALOG) 0.1 % Apply 1 application topically 2 (two) times daily as needed.   No facility-administered encounter medications on file as of 11/17/2018.     PHYSICAL EXAM/ROS:  Subjective weight loss, loose skin and loss of fat layer.  General: NAD, frail appearing,WNWD Cardiovascular: no c/o chest pain Pulmonary: no cough, not DOE during interview Abdomen: fair intake, continent of stool GU: continent Extremities: no edema, c/o neuropathic pain Skin: no rashes, no wounds Neurological: Weakness, describes burning neuropathic pain, states " it hurts like hell". FAST Score 6B-C.  Marijo File DNP, AGPCNP-BC

## 2018-12-10 ENCOUNTER — Telehealth: Payer: Self-pay | Admitting: Primary Care

## 2018-12-10 NOTE — Telephone Encounter (Signed)
T/c from wife Stanton Kidney with concerns about patient's decline and appropriateness for hospice referral. He has stopped eating and drinking all but bites and sips, has lost ability to ambulate with stand by assist and now cannot ambulate more than a few steps. 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  She will have office fax referral to [facility] this am. On call RN advised RE need for hospice admission pending  Hospice MD review. T/c back to family to advise of above. They voiced understanding and will await call from hospice RN.

## 2019-02-21 DEATH — deceased

## 2019-08-22 IMAGING — CT CT HEAD W/O CM
3 of 4 series · 15 of 47 positions shown, 18 images · non-contrast
Comparison: 10/23/2010.

CLINICAL DATA: Fall.

EXAM:
CT HEAD WITHOUT CONTRAST
TECHNIQUE: Contiguous axial images were obtained from the base of the skull
through the vertex without intravenous contrast.

[Series 3: head wo · axial · 0.47mm/px · z∈[-127,-2]mm · 9 of 31 slices shown, 12 images]
[im 3/31  brain]
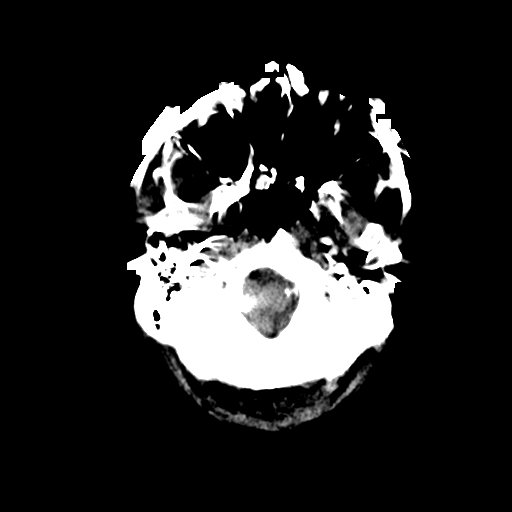
[im 3/31  bone]
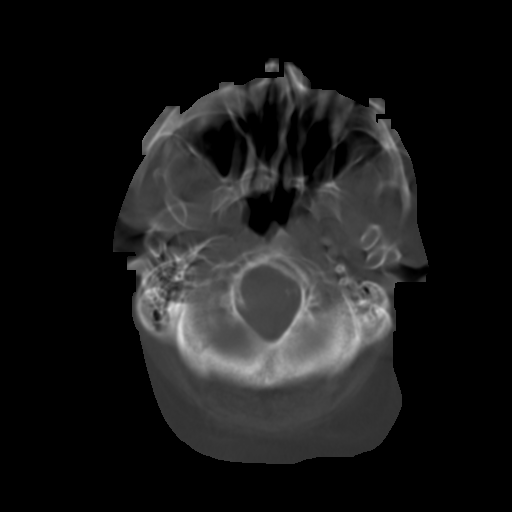
[im 6/31  brain]
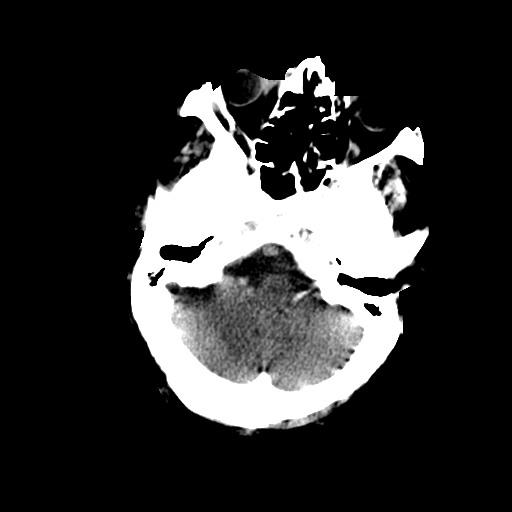
[im 9/31  brain]
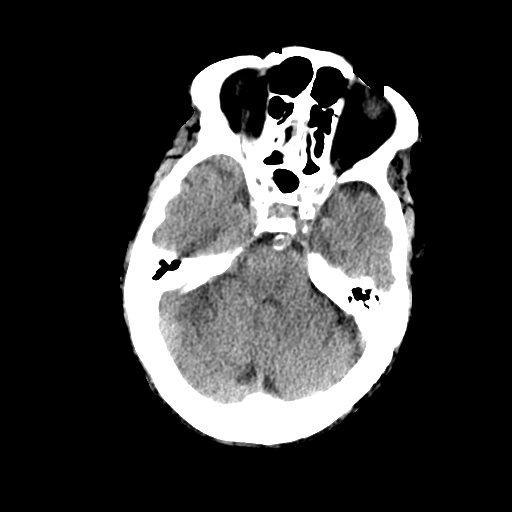
[im 13/31  brain]
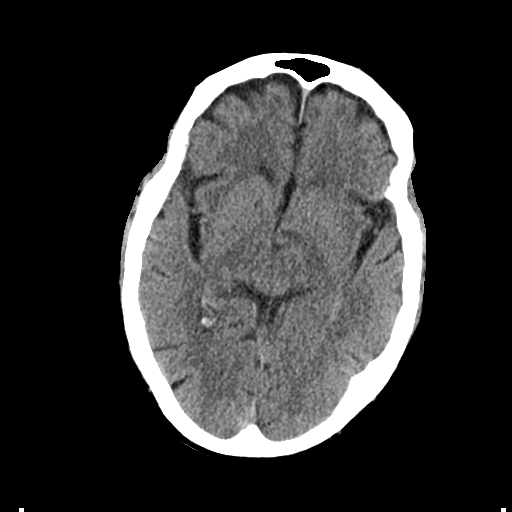
[im 15/31  brain]
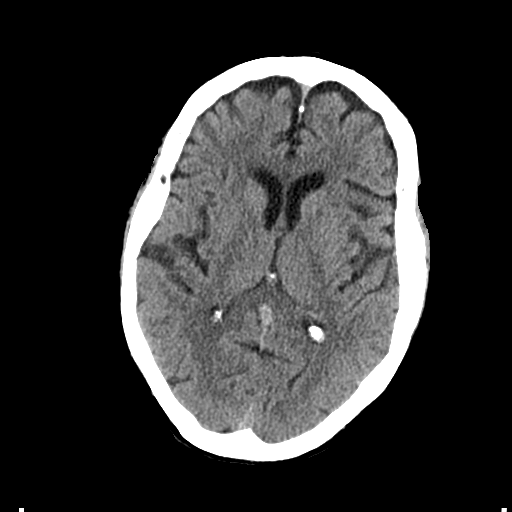
[im 15/31  bone]
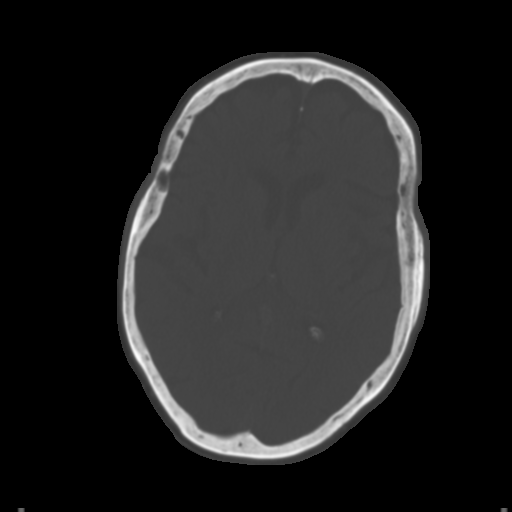
[im 18/31  brain]
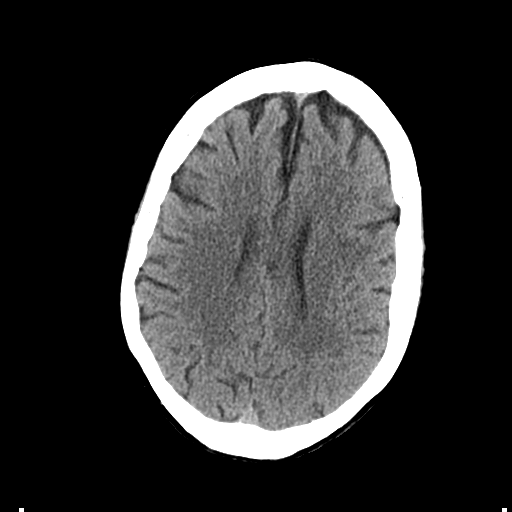
[im 22/31  brain]
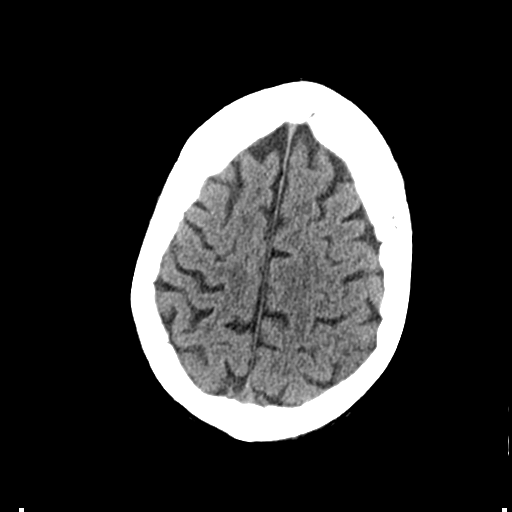
[im 25/31  brain]
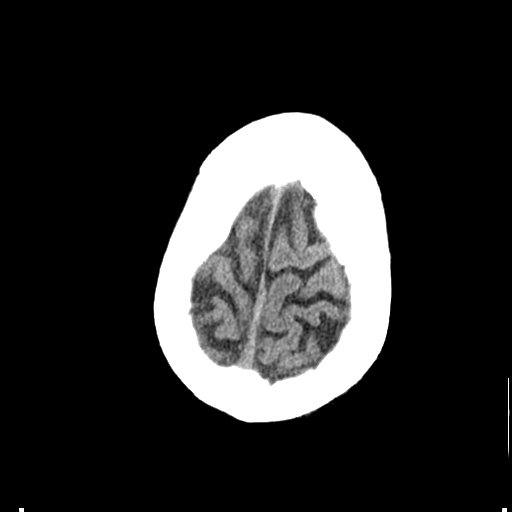
[im 28/31  brain]
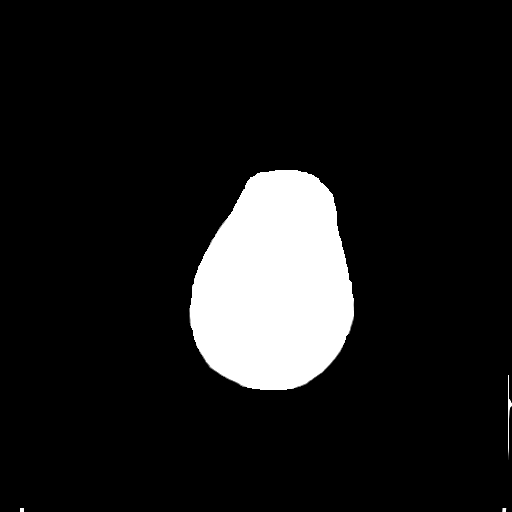
[im 28/31  bone]
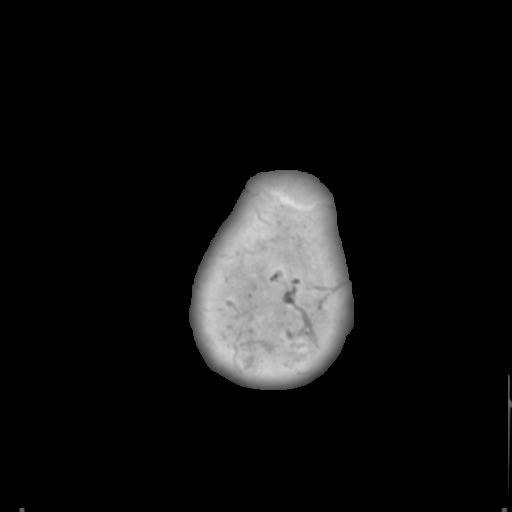

[Series 4: coronal soft tissue · coronal · 0.29mm/px · 3 of 68 slices shown]
[im 23/68  brain]
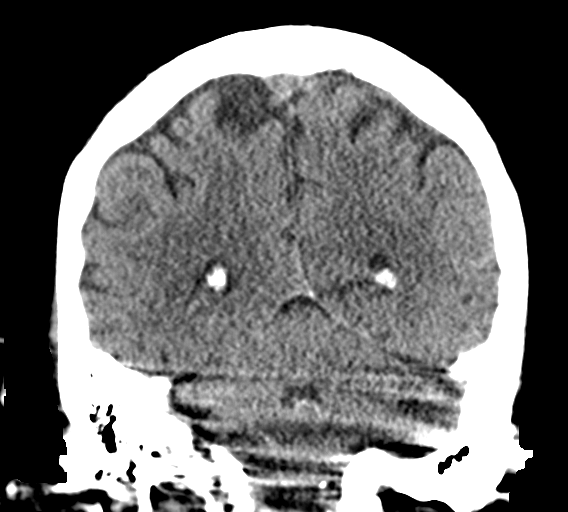
[im 30/68  brain]
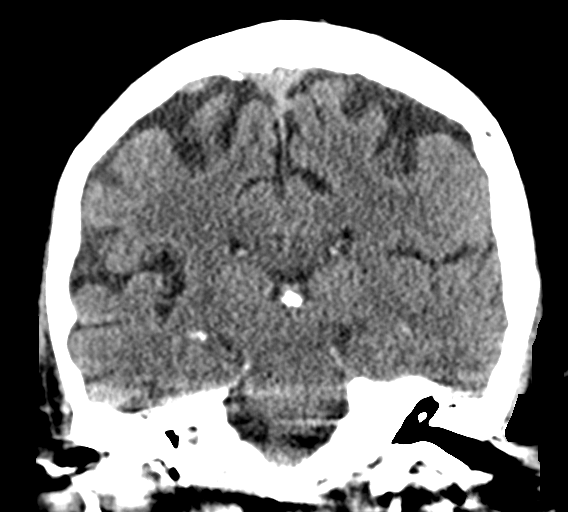
[im 38/68  brain]
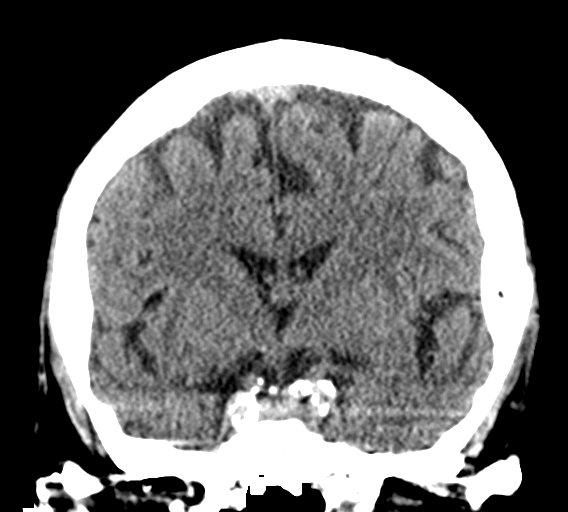

[Series 5: sagittal soft tissue · sagittal · 0.30mm/px · 3 of 54 slices shown]
[im 18/54  brain]
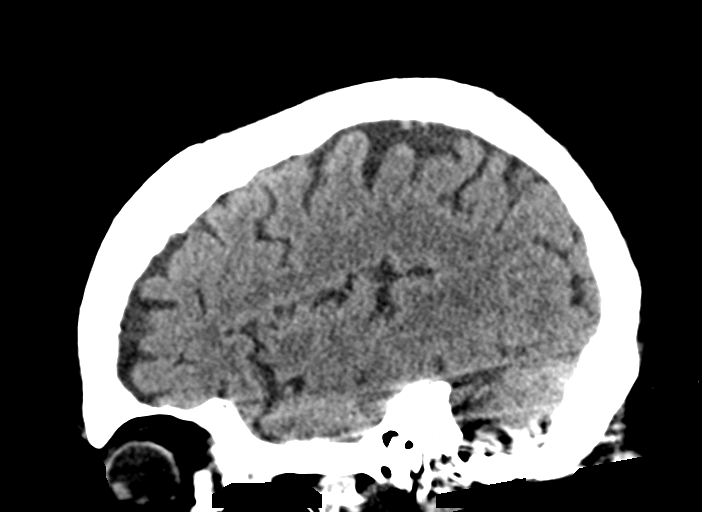
[im 27/54  brain]
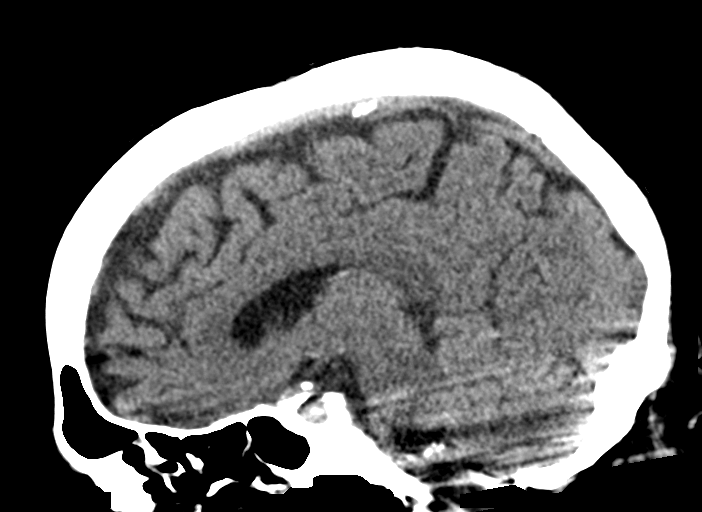
[im 36/54  brain]
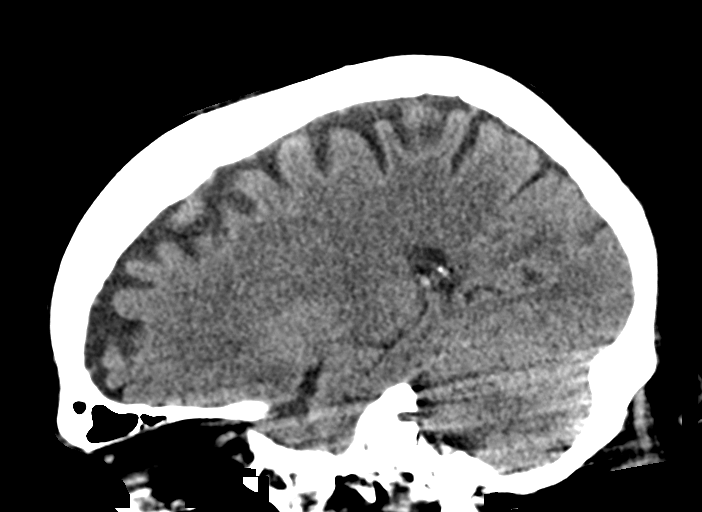

[15 of 47 positions shown; findings below may reference images not displayed]

FINDINGS: Brain: There is no evidence for acute hemorrhage, hydrocephalus,
mass lesion, or abnormal extra-axial fluid collection. No definite
CT evidence for acute infarction. Diffuse loss of parenchymal volume
is consistent with atrophy. Patchy low attenuation in the deep
hemispheric and periventricular white matter is nonspecific, but
likely reflects chronic microvascular ischemic demyelination.

Vascular: No hyperdense vessel or unexpected calcification.

Skull: No evidence for fracture. No worrisome lytic or sclerotic
lesion.

Sinuses/Orbits: The visualized paranasal sinuses and mastoid air
cells are clear. Visualized portions of the globes and intraorbital
fat are unremarkable.

Other: None.
IMPRESSION: 1. No acute intracranial abnormality.
2. Atrophy with chronic small vessel white matter ischemic disease.
